# Patient Record
Sex: Female | Born: 1960 | Race: White | Hispanic: No | Smoking: Former smoker
Health system: Southern US, Community
[De-identification: ages and names within clinical notes are randomized; demographics above are authoritative.]

## PROBLEM LIST (undated history)

## (undated) DIAGNOSIS — K219 Gastro-esophageal reflux disease without esophagitis: Secondary | ICD-10-CM

## (undated) DIAGNOSIS — M549 Dorsalgia, unspecified: Secondary | ICD-10-CM

## (undated) DIAGNOSIS — E789 Disorder of lipoprotein metabolism, unspecified: Secondary | ICD-10-CM

## (undated) DIAGNOSIS — E785 Hyperlipidemia, unspecified: Secondary | ICD-10-CM

## (undated) DIAGNOSIS — G4730 Sleep apnea, unspecified: Secondary | ICD-10-CM

## (undated) DIAGNOSIS — G2581 Restless legs syndrome: Secondary | ICD-10-CM

## (undated) DIAGNOSIS — IMO0001 Reserved for inherently not codable concepts without codable children: Secondary | ICD-10-CM

## (undated) DIAGNOSIS — D126 Benign neoplasm of colon, unspecified: Principal | ICD-10-CM

## (undated) DIAGNOSIS — F32A Depression, unspecified: Secondary | ICD-10-CM

## (undated) DIAGNOSIS — E7800 Pure hypercholesterolemia, unspecified: Secondary | ICD-10-CM

## (undated) DIAGNOSIS — I10 Essential (primary) hypertension: Secondary | ICD-10-CM

## (undated) DIAGNOSIS — C801 Malignant (primary) neoplasm, unspecified: Secondary | ICD-10-CM

## (undated) DIAGNOSIS — F419 Anxiety disorder, unspecified: Secondary | ICD-10-CM

## (undated) DIAGNOSIS — F329 Major depressive disorder, single episode, unspecified: Secondary | ICD-10-CM

## (undated) HISTORY — DX: Hyperlipidemia, unspecified: E78.5

## (undated) HISTORY — PX: SKIN CANCER EXCISION: SHX779

## (undated) HISTORY — DX: Anxiety disorder, unspecified: F41.9

## (undated) HISTORY — DX: Disorder of lipoprotein metabolism, unspecified: E78.9

## (undated) HISTORY — PX: APPENDECTOMY: SHX54

## (undated) HISTORY — PX: GALLBLADDER SURGERY: SHX652

## (undated) HISTORY — PX: VAGINAL HYSTERECTOMY: SHX2639

## (undated) HISTORY — DX: Reserved for inherently not codable concepts without codable children: IMO0001

## (undated) HISTORY — DX: Pure hypercholesterolemia, unspecified: E78.00

## (undated) HISTORY — PX: GANGLION CYST EXCISION: SHX1691

## (undated) HISTORY — DX: Benign neoplasm of colon, unspecified: D12.6

## (undated) HISTORY — DX: Sleep apnea, unspecified: G47.30

## (undated) HISTORY — DX: Gastro-esophageal reflux disease without esophagitis: K21.9

## (undated) HISTORY — DX: Essential (primary) hypertension: I10

## (undated) HISTORY — DX: Major depressive disorder, single episode, unspecified: F32.9

## (undated) HISTORY — DX: Restless legs syndrome: G25.81

## (undated) HISTORY — DX: Depression, unspecified: F32.A

---

## 1987-11-09 HISTORY — PX: ABDOMINAL HYSTERECTOMY: SHX81

## 1996-11-08 HISTORY — PX: SKIN GRAFT: SHX250

## 2001-06-14 ENCOUNTER — Ambulatory Visit (HOSPITAL_COMMUNITY): Admission: RE | Admit: 2001-06-14 | Discharge: 2001-06-14 | Payer: Self-pay | Admitting: Internal Medicine

## 2001-06-14 ENCOUNTER — Encounter: Payer: Self-pay | Admitting: Internal Medicine

## 2003-06-03 ENCOUNTER — Ambulatory Visit (HOSPITAL_COMMUNITY): Admission: RE | Admit: 2003-06-03 | Discharge: 2003-06-03 | Payer: Self-pay | Admitting: Internal Medicine

## 2003-06-03 ENCOUNTER — Encounter: Payer: Self-pay | Admitting: Internal Medicine

## 2005-08-02 ENCOUNTER — Ambulatory Visit (HOSPITAL_COMMUNITY): Admission: RE | Admit: 2005-08-02 | Discharge: 2005-08-02 | Payer: Self-pay | Admitting: Preventative Medicine

## 2005-09-01 ENCOUNTER — Ambulatory Visit (HOSPITAL_COMMUNITY): Admission: RE | Admit: 2005-09-01 | Discharge: 2005-09-01 | Payer: Self-pay | Admitting: Internal Medicine

## 2006-09-01 ENCOUNTER — Ambulatory Visit: Payer: Self-pay | Admitting: Internal Medicine

## 2006-09-05 ENCOUNTER — Ambulatory Visit (HOSPITAL_COMMUNITY): Admission: RE | Admit: 2006-09-05 | Discharge: 2006-09-05 | Payer: Self-pay | Admitting: Family Medicine

## 2007-11-09 HISTORY — PX: CHOLECYSTECTOMY: SHX55

## 2008-05-07 ENCOUNTER — Ambulatory Visit (HOSPITAL_COMMUNITY): Admission: RE | Admit: 2008-05-07 | Discharge: 2008-05-07 | Payer: Self-pay | Admitting: Family Medicine

## 2008-05-22 ENCOUNTER — Encounter (INDEPENDENT_AMBULATORY_CARE_PROVIDER_SITE_OTHER): Payer: Self-pay | Admitting: General Surgery

## 2008-05-22 ENCOUNTER — Ambulatory Visit (HOSPITAL_COMMUNITY): Admission: RE | Admit: 2008-05-22 | Discharge: 2008-05-22 | Payer: Self-pay | Admitting: General Surgery

## 2008-08-14 ENCOUNTER — Ambulatory Visit (HOSPITAL_COMMUNITY): Admission: RE | Admit: 2008-08-14 | Discharge: 2008-08-14 | Payer: Self-pay | Admitting: Family Medicine

## 2008-11-20 ENCOUNTER — Ambulatory Visit (HOSPITAL_COMMUNITY): Admission: RE | Admit: 2008-11-20 | Discharge: 2008-11-20 | Payer: Self-pay | Admitting: Family Medicine

## 2009-01-02 ENCOUNTER — Ambulatory Visit (HOSPITAL_COMMUNITY): Admission: RE | Admit: 2009-01-02 | Discharge: 2009-01-02 | Payer: Self-pay | Admitting: Family Medicine

## 2009-06-17 ENCOUNTER — Encounter (INDEPENDENT_AMBULATORY_CARE_PROVIDER_SITE_OTHER): Payer: Self-pay | Admitting: General Surgery

## 2009-06-17 ENCOUNTER — Ambulatory Visit (HOSPITAL_COMMUNITY): Admission: RE | Admit: 2009-06-17 | Discharge: 2009-06-17 | Payer: Self-pay | Admitting: General Surgery

## 2009-06-17 DIAGNOSIS — D126 Benign neoplasm of colon, unspecified: Secondary | ICD-10-CM

## 2009-06-17 HISTORY — DX: Benign neoplasm of colon, unspecified: D12.6

## 2009-06-17 HISTORY — PX: COLONOSCOPY: SHX174

## 2009-08-18 ENCOUNTER — Ambulatory Visit (HOSPITAL_COMMUNITY): Admission: RE | Admit: 2009-08-18 | Discharge: 2009-08-18 | Payer: Self-pay | Admitting: Family Medicine

## 2010-08-28 ENCOUNTER — Ambulatory Visit (HOSPITAL_COMMUNITY): Admission: RE | Admit: 2010-08-28 | Discharge: 2010-08-28 | Payer: Self-pay | Admitting: Family Medicine

## 2010-09-28 ENCOUNTER — Ambulatory Visit (HOSPITAL_COMMUNITY): Admission: RE | Admit: 2010-09-28 | Discharge: 2010-09-28 | Payer: Self-pay | Admitting: Urology

## 2011-01-18 ENCOUNTER — Encounter: Payer: Self-pay | Admitting: Orthopedic Surgery

## 2011-02-09 ENCOUNTER — Encounter: Payer: Self-pay | Admitting: Orthopedic Surgery

## 2011-02-09 ENCOUNTER — Ambulatory Visit (INDEPENDENT_AMBULATORY_CARE_PROVIDER_SITE_OTHER): Payer: BC Managed Care – PPO | Admitting: Orthopedic Surgery

## 2011-02-09 VITALS — HR 68 | Resp 16 | Ht 66.0 in | Wt 300.0 lb

## 2011-02-09 DIAGNOSIS — G5600 Carpal tunnel syndrome, unspecified upper limb: Secondary | ICD-10-CM

## 2011-02-09 MED ORDER — GABAPENTIN 100 MG PO CAPS: 100.0000 mg | ORAL_CAPSULE | ORAL | Status: DC

## 2011-02-09 NOTE — Progress Notes (Signed)
RIGHT hand numbness, index, and long finger. Symptoms worse RIGHT than LEFT. 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 Some of which cause her increased symptoms. Symptoms started, gradually she's been wearing a brace for over a year. She has a burning pain in the palm of the hand, which radiates into the fingers and seems to come and go. She's describes definite numbness and tingling.  Status post excision of dorsal ganglion. Several years ago appears to be unrelated.  Current pain level IV/X.  Positive review of systems findings, fatigue, redness, and watering of the eyes, shortness of breath and wheezing with snoring, heartburn, nausea, diarrhea, frequency, muscle, pain, and joint pain, numbness, and tingling, anxiety, depression, seasonal allergy.   Family History  Problem Relation Age of Onset  . Arthritis    . Diabetes      Past Medical History  Diagnosis Date  . Diabetes mellitus   . Asthma   . HTN (hypertension)   . Restless leg syndrome   . Anxiety   . Depression   . Reflux   . Cholesterol serum elevated     Past Surgical History  Procedure Date  . Vaginal hysterectomy   . Gallbladder surgery   . Ganglion cyst excision hand x 2  . Skin cancer excision on nose    Vital signs reviewed and recorded as is.  General appearance is normal. Mod obesity  Orientation x 3, mood and affect are normal.  Ambulation is normal.  Decreased sensation is noted to sharp touch and soft touch in the long, index, and ring finger, RIGHT good LEFT. Weakness of grip RIGHT compared to LEFT. Range of motion wrist and normal bilaterally.  Phalen's test was positive on the RIGHT exhibited by throbbing. Negative on the LEFT.  Wrist joints are stable.  Pulses and perfusion and color and temperature are normal in both hands.

## 2011-02-10 ENCOUNTER — Ambulatory Visit: Payer: Self-pay | Admitting: Orthopedic Surgery

## 2011-02-11 ENCOUNTER — Telehealth: Payer: Self-pay | Admitting: Radiology

## 2011-02-11 NOTE — Telephone Encounter (Signed)
I faxed a referral for this patient to Dr. Gerilyn Pilgrim for a NCS of her upper extremities.

## 2011-02-13 LAB — GLUCOSE, CAPILLARY: Glucose-Capillary: 134 mg/dL — ABNORMAL HIGH (ref 70–99)

## 2011-03-23 NOTE — H&P (Signed)
NAMELASONJA, LAKINS            ACCOUNT NO.:  [id number]   MEDICAL RECORD NO.:  [id number]          PATIENT TYPE:  AMB   LOCATION:  DAY                           FACILITY:  [facility]   PHYSICIAN:  Dalia Heading, M.D.  DATE OF BIRTH:  04-05-1961   DATE OF ADMISSION:  DATE OF DISCHARGE:  LH                              HISTORY & PHYSICAL   CHIEF COMPLAINT:  Need for screening colonoscopy.   HISTORY OF PRESENT ILLNESS:  The patient is a 50 year old white female  who is referred for endoscopic evaluation.  She needs a colonoscopy for  screening purposes.  No abdominal pain, weight loss, nausea, vomiting,  diarrhea, constipation, melena, or hematochezia have been noted.  She  has never had a colonoscopy.  There is no family history of colon  carcinoma.   PAST MEDICAL HISTORY:  Hypertension, non-insulin dependent diabetes  mellitus, high cholesterol levels, muscle spasms.   PAST SURGICAL HISTORY:  Cholecystectomy.   CURRENT MEDICATIONS:  Fish oil, Prilosec, losartan, Claritin, Xanax,  metformin, Flexeril, Lasix, Budeprion, Caltrate, simvastatin, Motrin  allergies, penicillin, and sulfa.   REVIEW OF SYSTEMS:  Noncontributory.   PHYSICAL EXAMINATION:  GENERAL:  The patient is a well-developed, well-  nourished white female in no acute distress.  LUNGS:  Clear to auscultation with equal breath sounds bilaterally.  HEART:  Regular rate and rhythm without S3, S4, or murmurs.  ABDOMEN:  Soft, nontender, nondistended.  No hepatosplenomegaly or  masses are noted.  RECTUM:  Examination was deferred to the procedure.   IMPRESSION:  Need for screening colonoscopy.   PLAN:  The patient is scheduled for a colonoscopy on June 17, 2009.  The risks and benefits of the procedure including bleeding and  perforation were fully explained to the patient, gave informed consent.      Dalia Heading, M.D.  Electronically Signed     MAJ/MEDQ  D:  06/10/2009  T:  06/11/2009  Job:  [id number]   cc:   Corrie Mckusick, M.D.  Fax: [phone]   Short-Stay at [facility]

## 2011-03-23 NOTE — H&P (Signed)
[name], [name]            ACCOUNT NO.:  [id number]   MEDICAL RECORD NO.:  [id number]          PATIENT TYPE:  AMB   LOCATION:  DAY                           FACILITY:  [facility]   PHYSICIAN:  Tilford Pillar, MD      DATE OF BIRTH:  October 21, 1961   DATE OF ADMISSION:  DATE OF DISCHARGE:  LH                              HISTORY & PHYSICAL   CHIEF COMPLAINT:  Left-sided abdominal pain.   HISTORY OF PRESENT ILLNESS:  The patient is a 50 year old female with a  history of left-sided abdominal pain described as sharp, relatively  constant, and worst with palpation.  This started approximately April 15, 2008, with no significant change.  No notable trauma occurred.  He does  state that standing does seem to make it worse.  She has no relieving  features.  There is radiation to the back.  She does have occasional  nausea.  No changes in bowel movement.  Denies any hematochezia or  melena.  No changes with urination.  No changes with diet.  No fevers or  chills.  She does have significant bloating.  She does have belching and  flatus.   PAST MEDICAL HISTORY:  1. Diabetes mellitus type 2.  2. Gastroesophageal reflux disease.  3. Anxiety.   PAST SURGICAL HISTORY:  None.   MEDICATIONS:  Multiple and reviewed on the chart:  1. Benicar.  2. Xanax.  3. Prilosec.  4. Claritin.  5. Lasix.  6. Metformin.   ALLERGIES:  PENICILLIN causes rash and SULFA causes rash.   SOCIAL HISTORY:  No tobacco, no alcohol use, and no recreational drug  use.  Occupation:  She is a Location manager.   Pertinent family history is significant for positive gallbladder disease  and diabetes mellitus.   REVIEW OF SYSTEMS:  CONSTITUTIONAL:  Unremarkable.  EYES:  Unremarkable.  EARS, NOSE, AND THROAT:  Unremarkable.  RESPIRATORY:  Unremarkable.  CARDIOVASCULAR:  Unremarkable.  GASTROINTESTINAL:  Abdominal pain left-  sided as per HPI.  GENITOURINARY:  Unremarkable.  MUSCULOSKELETAL:  Arthralgias in the lower  back.  SKIN:  Unremarkable.  ENDOCRINE:  Heat  intolerance.  NEURO:  Unremarkable.   PHYSICAL EXAM:  GENERAL:  The patient is a morbidly obese, slightly  disheveled female in no acute distress.  Alert and oriented x3.  HEENT:  Scalp no deformities.  No masses.  Eyes:  Pupils equal, round,  and reactive.  Extraocular movements intact.  No scleral icterus or  conjunctival pallor is noted.  Oral mucosa is pink and moist.  No  malocclusion.  NECK:  Trachea is midline.  No cervical lymphadenopathy appreciated.  PULMONARY:  Unlabored respirations.  No wheezes.  No crackles.  She is  clear to auscultation in bilateral lung fields.  CARDIOVASCULAR:  Regular rate and rhythm.  No murmurs or gallops are  appreciated on evaluation.  Pulses are 2+ radial.  CHEST EVALUATION:  The patient does not have any notable masses on  either chest wall.  No chest wall deformity was noted.  She does have  left-sided chest wall pain with palpation of the costovertebral angle  tenderness with palpation of the lower ribs.  ABDOMEN:  Positive bowel sounds.  Abdomen is soft.  She does have some  slight tenderness in the left upper quadrant.  No right upper quadrant  or Murphy sign.  No hernias.  No masses.  SKIN:  Warm and dry.  EXTREMITIES:  Atraumatic.  No deformities.   PERTINENT LABORATORY AND RADIOGRAPHIC STUDIES:  The patient had a CT  evaluation of the abdomen and pelvis, which demonstrates no evidence of  acute process and no evidence of renal tumor.  She does have noted  gallstones in the gallbladder on evaluation.   ASSESSMENT AND PLAN:  1. Osteochondritis of the left chest wall.  2. Cholelithiasis.  In regards to the patient's presentation, it was      discussed at length with the patient her current symptomatology.      At this point, the majority of her symptomatology does appear to be      coming from the osteochondritis of the left chest wall based on      evaluation today.  We recommend  continuing nonsteroidal anti-      inflammatory medications, limited activity, and allow time for      recovery of this.  The patient's concerns were addressed regarding      this.  I do not think any acute injury has occurred.  I have low      suspicion for any refracture or bony abnormalities.  3. In regards to her cholelithiasis, she is a diabetic patient with      moderate obesity.  I did discuss with the patient that in the near      future risks for cholelithiasis, I would recommend proceeding with      a laparoscopic possible open cholecystectomy given the fact of her      diabetes in the presence of biliary stones.  Risks, benefits, and      alternatives of laparoscopic possible open cholecystectomy were      discussed at length with the patient including, but not limited to      the risk of bleeding, infection, bile leak, small bowel injury,      bile duct injury, as well as the possibility of intraoperative      cardiac or pulmonary events.  Complications associated with the      gallstones in a diabetic patient were also discussed with the      patient including but not limited to risks of acute cholecystitis      concerning cholangitis as well as a possibility of pancreatitis.      This was discussed with the      patient.  I did explain at length her the symptomatology from the      osteochondritis will not improve with cholecystectomy; however, the      patient does wish to proceed at this time with the laparoscopic      possible open cholecystectomy.  We will proceed with the patient      understanding the risks, benefits, and alternatives.      Tilford Pillar, MD  Electronically Signed     BZ/MEDQ  D:  05/21/2008  T:  05/22/2008  Job:  [id number]   cc:   Corrie Mckusick, M.D.  Fax: [phone]

## 2011-03-24 ENCOUNTER — Ambulatory Visit (INDEPENDENT_AMBULATORY_CARE_PROVIDER_SITE_OTHER): Payer: BC Managed Care – PPO | Admitting: Orthopedic Surgery

## 2011-03-24 ENCOUNTER — Encounter: Payer: Self-pay | Admitting: Orthopedic Surgery

## 2011-03-24 DIAGNOSIS — G5601 Carpal tunnel syndrome, right upper limb: Secondary | ICD-10-CM

## 2011-03-24 DIAGNOSIS — G5600 Carpal tunnel syndrome, unspecified upper limb: Secondary | ICD-10-CM

## 2011-03-24 MED ORDER — METHYLPREDNISOLONE ACETATE 40 MG/ML IJ SUSP: 40.0000 mg | Freq: Once | INTRAMUSCULAR | Status: AC

## 2011-03-24 NOTE — Patient Instructions (Signed)
You have received a steroid shot. 15% of patients experience increased pain at the injection site with in the next 24 hours. This is best treated with ice and tylenol extra strength 2 tabs every 8 hours. If you are still having pain please call the office.    

## 2011-03-24 NOTE — Progress Notes (Signed)
Carpal tunnel injection RIGHT  Consent  Time out  The carpal tunnel was injected with Depo-Medrol 40 mg 1 cc and 3 cc of 1% lidocaine tolerated well without complication  He uses alcohol and ethyl chloride for prep.

## 2011-03-24 NOTE — Progress Notes (Signed)
Followup visit  History of bilateral carpal tunnel syndrome sent for nerve conduction study which shows severe RIGHT median neuropathy moderately severe on the LEFT  RIGHT seems to bother her the most  We offered her further medical treatment, surgical treatment she preferred injection  We gave her a RIGHT carpal tunnel injection  LEFT side seems to bother her as well but not as much.  We do note that the Neurontin didn't help her pain overall  Review of systems she had some swelling of the RIGHT thumb which seems to be a bone spur  Exam of the RIGHT thumb shows equal laxity to the LEFT with good stability in flexion and extension.  No tenderness.  Diagnosis bilateral carpal tunnel syndrome RIGHT greater than LEFT  Patient will call us if this does not help her.

## 2011-03-26 ENCOUNTER — Telehealth: Payer: Self-pay | Admitting: Orthopedic Surgery

## 2011-03-26 NOTE — Op Note (Signed)
[name], [name]            ACCOUNT NO.:  [id number]   MEDICAL RECORD NO.:  [id number]          PATIENT TYPE:  AMB   LOCATION:  DAY                           FACILITY:  [facility]   PHYSICIAN:  Tilford Pillar, MD      DATE OF BIRTH:  24-Dec-1960   DATE OF PROCEDURE:  DATE OF DISCHARGE:                               OPERATIVE REPORT   PREOPERATIVE DIAGNOSIS:  Cholelithiasis.   POSTOPERATIVE DIAGNOSIS:  Cholelithiasis.   PROCEDURE:  Laparoscopic cholecystectomy.   SURGEON:  Tilford Pillar, MD   ANESTHESIA:  General endotracheal local anesthetic, 0.5% Marcaine plain.   ESTIMATED BLOOD LOSS:  Minimal.   INDICATIONS:  The patient is a 50 year old patient who presents to my  office after referral by Dr. Phillips Odor for evaluation of cholelithiasis.  She actually had the initial workup started after having a bout of  costochondritis of the left chest wall.  She had actually presented with  left chest wall pain and during the workup was diagnosed with the  cholelithiasis.  She has had some symptomatology, which is consistent  with biliary disease, although at this point she has no significant  symptomatology.  She is also diabetic with the presence of gallstones  and the risk of nonintervention were discussed at length with the  patient.  The risks, benefits, alternatives of laparoscopic possible  open cholecystectomy including, but not limited to risk of bleeding,  infection, bile leak, small bowel injury as well as possibility of  common bile duct injury with possible intraoperative cardiac or  pulmonary events were discussed at length with the patient.  The  patient's questions and concerns were addressed, and the patient was  consented for planned procedure.   OPERATION:  The patient was taken to the operating room and was placed  in supine position on the operating table, at which time a general  anesthetic was administered.  Once the patient was asleep, she was  endotracheally  intubated by anesthesia.  At this point, her abdomen was  prepped and draped in the usual fashion.  Stab incision was created  supraumbilically with an 11-blade scalpel.  Additional dissection down  through subcuticular tissue was carried out using a Kocher clamp, which  was utilized to grasp the anterior abdominal fascia and move this  anteriorly.  A Veress needle was inserted.  Saline drop test was  utilized to confirm intraperitoneal placement and then pneumoperitoneum  was initiated.  Once sufficient pneumoperitoneum was obtained, an 11-mm  trocar was inserted over the laparoscope allowing visualization of the  trocar entering into the peritoneum.  At this point, the inner cannula  was removed.  The laparoscope was reinserted, and there was no evidence  of any trocar or Veress needle placement injury.  At this time, the  remaining trocars were placed, 11-mm trocar in the epigastrium, 5-mm  trocar between two 11-mm trocars and a 5-mm trocar in the right lateral  abdominal wall.  The patient was placed in reverse Trendelenburg left  lateral decubitus position.  The fundus of gallbladder were grasped and  lifted up over the liver.  A blunt dissection was  utilized to dissect  the peritoneal reflection off the infundibulum exposing the cystic duct  as it entered into the infundibulum.  A window was created behind the  cystic duct, three Endoclips were placed proximally, one distally, and  the cystic duct was divided between two most distal clips.  Similarly,  the cystic artery was identified.  One window was created behind the  structures as well and 2 Endoclips were placed proximally, one distally  and cystic artery was divided between two most distal clips.  Electrocautery was utilized to dissect the gallbladder free from the  gallbladder fossa.  Hemostasis was obtained during this using  electrocautery.  Once the gallbladder was free, it was placed in  EndoCatch bag, which was then  placed up and over the right lobe of the  liver.  There was a small amount of bile spillage during this process  from a small cholecystotomy created during the dissection, which was  closely aspirated with just a regular aspirator.  No stones were spilled  with the gallbladder up and over the right lobe of the liver.  Attention  was turned to closure.   A 2-0 Vicryl suture was placed through both the 11-mm trocars with an  Endoclose suture passing device with the sutures in place.  The  gallbladder fossa was reevaluated.  Hemostasis was excellent.  A piece  of Surgicel was placed into the gallbladder fossa and then the  gallbladder was removed in an intact EndoCatch bag through the  epigastric trocar site.  Some blunt dilatation was required to enlarge  the trocar site enough to remove the gallbladder.  Once the gallbladder  was free, it was placed on the back table and sent as a permanent  specimen to pathology.  At this point, the pneumoperitoneum was  evacuated.  The trocars were removed.  The Vicryl sutures were secured.  Local anesthetic was instilled and 4-0 Monocryl was utilized to  reapproximate the skin edges at all four trocar sites.  Skin was washed  and dried with a moist dry towel.  Benzoin was applied around the  incision.  Half inch Steri-Strips were placed.  The patient was allowed  to come out of general anesthetic and was transferred back to regular  hospital bed in stable condition.  At the conclusion of the procedure,  all sponge and needle counts were correct.  The patient tolerated the  procedure well.      Tilford Pillar, MD  Electronically Signed     BZ/MEDQ  D:  05/30/2008  T:  05/31/2008  Job:  [id number]   cc:   Dr. Phillips Odor

## 2011-03-26 NOTE — Telephone Encounter (Signed)
Received injection 03/24/11, thumb & index still numb, having pain when bending wrist or using her hand.  Taking 1,000 mg Tylenol every 8 hours and using ice when not at work. Concerned that she still is hurting.  Her # is [phone], can leave a message, as she is the only one there

## 2011-03-29 NOTE — Telephone Encounter (Signed)
Called and left message on machine to call me with her pharmacy

## 2011-03-29 NOTE — Telephone Encounter (Signed)
Chasity call this patient and tell her she is having post injection reaction   Apply ice and call her in some prednisone 10 mg tid x 3 days and neurontin 100mg  tid x 10 days

## 2011-03-30 ENCOUNTER — Other Ambulatory Visit: Payer: Self-pay | Admitting: *Deleted

## 2011-03-30 DIAGNOSIS — R52 Pain, unspecified: Secondary | ICD-10-CM

## 2011-03-30 MED ORDER — GABAPENTIN 100 MG PO CAPS: 100.0000 mg | ORAL_CAPSULE | Freq: Three times a day (TID) | ORAL | Status: DC

## 2011-03-30 NOTE — Telephone Encounter (Signed)
[name] called back, said she cannot take Predisone because it elevates her sugar level a lot.  As far as she knows, the  Neurontin is ok.  She uses [facility]

## 2011-04-08 ENCOUNTER — Encounter: Payer: Self-pay | Admitting: Orthopedic Surgery

## 2011-06-01 ENCOUNTER — Ambulatory Visit (INDEPENDENT_AMBULATORY_CARE_PROVIDER_SITE_OTHER): Payer: BC Managed Care – PPO | Admitting: Orthopedic Surgery

## 2011-06-01 ENCOUNTER — Encounter: Payer: Self-pay | Admitting: Orthopedic Surgery

## 2011-06-01 DIAGNOSIS — R52 Pain, unspecified: Secondary | ICD-10-CM

## 2011-06-01 MED ORDER — GABAPENTIN 100 MG PO CAPS: 300.0000 mg | ORAL_CAPSULE | Freq: Once | ORAL | Status: DC

## 2011-06-01 NOTE — Progress Notes (Signed)
Followup visit, carpal tunnel syndrome, RIGHT wrist, and hand.  Continues with numbness in the thumb and index finger.  We discussed her medication including Neurontin, decided to go with 300 mg q.h.s.    She decided to go ahead and have the surgery, come back in August to set up the surgery.

## 2011-06-01 NOTE — Patient Instructions (Signed)
Come back end of August to schedule CTR

## 2011-07-06 ENCOUNTER — Encounter: Payer: Self-pay | Admitting: Orthopedic Surgery

## 2011-07-06 ENCOUNTER — Ambulatory Visit (INDEPENDENT_AMBULATORY_CARE_PROVIDER_SITE_OTHER): Payer: BC Managed Care – PPO | Admitting: Orthopedic Surgery

## 2011-07-06 DIAGNOSIS — G5600 Carpal tunnel syndrome, unspecified upper limb: Secondary | ICD-10-CM

## 2011-07-06 DIAGNOSIS — G5601 Carpal tunnel syndrome, right upper limb: Secondary | ICD-10-CM

## 2011-07-06 NOTE — Patient Instructions (Signed)
You have been scheduled for surgery.  All surgeries carry some risk.  Remember you always have the option of continued nonsurgical treatment. However in this situation the risks vs. the benefits favor surgery as the best treatment option. The risks of the surgery includes the following but is not limited to bleeding, infection, pulmonary embolus, death from anesthesia, nerve injury vascular injury or need for further surgery, continued pain.  Specific to this procedure the following risks and complications are rare but possible Tenderness over the incision, and incomplete relief of numbness and tingling

## 2011-07-06 NOTE — Progress Notes (Signed)
Chief complaint: pain paresthesias right hand / pre-op for surgery HPI:(25) A 50 year old female with history of carpal tunnel syndrome of the RIGHT upper extremity treated with nonoperative measures, including oral medications, and wrist injections, as well as splinting, and activity modification. These measures have failed to give her satisfactory relief, and she would like to proceed with a carpal tunnel release via open technique.  Risk-benefit ratio has been explained to the patient. She has agreed to the surgical treatment with the understanding that she may have some skin irritation from the incision and may not have complete relief of her symptoms.  ROS: Positive review of systems findings, fatigue, redness, and watering of the eyes, shortness of breath and wheezing with snoring, heartburn, nausea, diarrhea, frequency, muscle, pain, and joint pain, numbness, and tingling, anxiety, depression, seasonal allergy.   PFSH: (1)  Past Medical History  Diagnosis Date  . Diabetes mellitus   . Asthma   . HTN (hypertension)   . Restless leg syndrome   . Anxiety   . Depression   . Reflux   . Cholesterol serum elevated    Past Surgical History  Procedure Date  . Vaginal hysterectomy   . Gallbladder surgery   . Ganglion cyst excision hand x 2  . Skin cancer excision on nose   History   Social History  . Marital Status: Widowed    Spouse Name: N/A    Number of Children: N/A  . Years of Education: 12th grade   Occupational History  . sewing Location manager    Social History Main Topics  . Smoking status: Never Smoker   . Smokeless tobacco: Not on file  . Alcohol Use: No  . Drug Use: Not on file  . Sexually Active: Not on file   Other Topics Concern  . Not on file   Social History Narrative  . No narrative on file      Physical Exam(12) GENERAL: normal development   CDV: pulses are normal   Skin: normal  Lymph: nodes were not palpable/normal  Psychiatric: awake,  alert and oriented  Neuro: normal sensation  MSK right upper extremity 1 Tenderness over the carpal tunnel. 2 Normal range of motion. 3 Normal strength. 4 Decreased sensation in the carpal tunnel distribution of the median nerve of the RIGHT upper extremity. 5 Wrist stability, normal. 6 Positive Tinel's test. Positive flexion test.  Imaging: No imaging, needed  Assessment: RIGHT carpal tunnel syndrome    Plan: Open RIGHT carpal tunnel release scheduled for September 5 with a post a visit scheduled for September 10.  You have been scheduled for surgery.  All surgeries carry some risk.  Remember you always have the option of continued nonsurgical treatment. However in this situation the risks vs. the benefits favor surgery as the best treatment option. The risks of the surgery includes the following but is not limited to bleeding, infection, pulmonary embolus, death from anesthesia, nerve injury vascular injury or need for further surgery, continued pain.  Specific to this procedure the following risks and complications are rare but possible Tenderness over the incision, and incomplete relief of numbness and tingling

## 2011-07-08 NOTE — Patient Instructions (Addendum)
18 [name]  07/08/2011   Your procedure is scheduled on:07/14/2011  Report to [name] at   615  AM.  Call this number if you have problems the morning of surgery: [phone]   Remember:   Do not eat food:After Midnight.  Do not drink clear liquids: After Midnight.  Take these medicines the morning of surgery with A SIP OF WATER: alprazolam,wellbutrin,citalopram,prilosec,neurontin   Do not wear jewelry, make-up or nail polish.  Do not wear lotions, powders, or perfumes. You may wear deodorant.  Do not shave 48 hours prior to surgery.  Do not bring valuables to the hospital.  Contacts, dentures or bridgework may not be worn into surgery.  Leave suitcase in the car. After surgery it may be brought to your room.  For patients admitted to the hospital, checkout time is 11:00 AM the day of discharge.   Patients discharged the day of surgery will not be allowed to drive home.  Name and phone number of your driver: family  Special Instructions: CHG Shower Use Special Wash: 1/2 bottle night before surgery and 1/2 bottle morning of surgery.   Please read over the following fact sheets that you were given: Pain Booklet, MRSA Information, Surgical Site Infection Prevention, Anesthesia Post-op Instructions and Care and Recovery After Surgery PATIENT INSTRUCTIONS POST-ANESTHESIA  IMMEDIATELY FOLLOWING SURGERY:  Do not drive or operate machinery for the first twenty four hours after surgery.  Do not make any important decisions for twenty four hours after surgery or while taking narcotic pain medications or sedatives.  If you develop intractable nausea and vomiting or a severe headache please notify your doctor immediately.  FOLLOW-UP:  Please make an appointment with your surgeon as instructed. You do not need to follow up with anesthesia unless specifically instructed to do so.  WOUND CARE INSTRUCTIONS (if applicable):  Keep a dry clean dressing on the anesthesia/puncture wound site if  there is drainage.  Once the wound has quit draining you may leave it open to air.  Generally you should leave the bandage intact for twenty four hours unless there is drainage.  If the epidural site drains for more than 36-48 hours please call the anesthesia department.  QUESTIONS?:  Please feel free to call your physician or the hospital operator if you have any questions, and they will be happy to assist you.     [facility] Anesthesia Department [address] [place] [phone]

## 2011-07-09 ENCOUNTER — Encounter (HOSPITAL_COMMUNITY): Payer: Self-pay

## 2011-07-09 ENCOUNTER — Telehealth: Payer: Self-pay | Admitting: Orthopedic Surgery

## 2011-07-09 ENCOUNTER — Encounter (HOSPITAL_COMMUNITY)
Admission: RE | Admit: 2011-07-09 | Discharge: 2011-07-09 | Disposition: A | Discharge status: Home / Self Care | Payer: BC Managed Care – PPO | Source: Ambulatory Visit | Attending: Orthopedic Surgery | Admitting: Orthopedic Surgery

## 2011-07-09 ENCOUNTER — Other Ambulatory Visit: Payer: Self-pay

## 2011-07-09 HISTORY — DX: Gastro-esophageal reflux disease without esophagitis: K21.9

## 2011-07-09 HISTORY — DX: Malignant (primary) neoplasm, unspecified: C80.1

## 2011-07-09 LAB — SURGICAL PCR SCREEN
MRSA, PCR: NEGATIVE
Staphylococcus aureus: NEGATIVE

## 2011-07-09 LAB — BASIC METABOLIC PANEL
Chloride: 99 mEq/L (ref 96–112)
GFR calc Af Amer: >60 mL/min (ref >60)
Potassium: 3.1 mEq/L — ABNORMAL LOW (ref 3.5–5.1)
Sodium: 139 mEq/L (ref 135–145)

## 2011-07-09 LAB — CBC
Platelets: 292 10*3/uL (ref 150–400)
RDW: 13.2 % (ref 11.5–15.5)
WBC: 12.4 10*3/uL — ABNORMAL HIGH (ref 4.0–10.5)

## 2011-07-09 NOTE — Telephone Encounter (Signed)
Called [employer] at [phone] re: out-patient surgery scheduled at [facility] 07/14/11, CPT code 08657, ICD9 code 354.0.  Spoke with Bonnita T; states no pre-authorization required for this procedure.  Her name and today's date for reference.

## 2011-07-13 NOTE — Pre-Procedure Instructions (Signed)
Potassium level 3.1. Dr. Jayme Cloud notified of results. Order obtained for istat morning prior to surgery.

## 2011-07-13 NOTE — H&P (Signed)
Chief complaint: pain paresthesias right hand / pre-op for surgery HPI:(4) A 50-year-old female with history of carpal tunnel syndrome of the RIGHT upper extremity treated with nonoperative measures, including oral medications, and wrist injections, as well as splinting, and activity modification. These measures have failed to give her satisfactory relief, and she would like to proceed with a carpal tunnel release via open technique.  Risk-benefit ratio has been explained to the patient. She has agreed to the surgical treatment with the understanding that she may have some skin irritation from the incision and may not have complete relief of her symptoms.  ROS: Positive review of systems findings, fatigue, redness, and watering of the eyes, shortness of breath and wheezing with snoring, heartburn, nausea, diarrhea, frequency, muscle, pain, and joint pain, numbness, and tingling, anxiety, depression, seasonal allergy.   PFSH: (1)  Past Medical History  Diagnosis Date  . Diabetes mellitus   . Asthma   . HTN (hypertension)   . Restless leg syndrome   . Anxiety   . Depression   . Reflux   . Cholesterol serum elevated    Past Surgical History  Procedure Date  . Vaginal hysterectomy   . Gallbladder surgery   . Ganglion cyst excision hand x 2  . Skin cancer excision on nose   History   Social History  . Marital Status: Widowed    Spouse Name: N/A    Number of Children: N/A  . Years of Education: 12th grade   Occupational History  . sewing machine operator    Social History Main Topics  . Smoking status: Never Smoker   . Smokeless tobacco: Not on file  . Alcohol Use: No  . Drug Use: Not on file  . Sexually Active: Not on file   Other Topics Concern  . Not on file   Social History Narrative  . No narrative on file      Physical Exam(12) GENERAL: normal development   CDV: pulses are normal   Skin: normal  Lymph: nodes were not palpable/normal  Psychiatric: awake,  alert and oriented  Neuro: normal sensation  MSK right upper extremity 1 Tenderness over the carpal tunnel. 2 Normal range of motion. 3 Normal strength. 4 Decreased sensation in the carpal tunnel distribution of the median nerve of the RIGHT upper extremity. 5 Wrist stability, normal. 6 Positive Tinel's test. Positive flexion test.  Imaging: No imaging, needed  Assessment: RIGHT carpal tunnel syndrome    Plan: Open RIGHT carpal tunnel release scheduled for September 5 with a post a visit scheduled for September 10.  You have been scheduled for surgery.  All surgeries carry some risk.  Remember you always have the option of continued nonsurgical treatment. However in this situation the risks vs. the benefits favor surgery as the best treatment option. The risks of the surgery includes the following but is not limited to bleeding, infection, pulmonary embolus, death from anesthesia, nerve injury vascular injury or need for further surgery, continued pain.  Specific to this procedure the following risks and complications are rare but possible Tenderness over the incision, and incomplete relief of numbness and tingling     of numbness and tingling

## 2011-07-14 ENCOUNTER — Ambulatory Visit (HOSPITAL_COMMUNITY)
Admission: RE | Admit: 2011-07-14 | Discharge: 2011-07-14 | Disposition: A | Discharge status: Home / Self Care | Payer: BC Managed Care – PPO | Source: Ambulatory Visit | Attending: Orthopedic Surgery | Admitting: Orthopedic Surgery

## 2011-07-14 ENCOUNTER — Encounter (HOSPITAL_COMMUNITY): Payer: Self-pay | Admitting: Anesthesiology

## 2011-07-14 ENCOUNTER — Encounter (HOSPITAL_COMMUNITY)
Admission: RE | Disposition: A | Discharge status: Home / Self Care | Payer: Self-pay | Source: Ambulatory Visit | Attending: Orthopedic Surgery

## 2011-07-14 ENCOUNTER — Ambulatory Visit (HOSPITAL_COMMUNITY): Payer: BC Managed Care – PPO | Admitting: Anesthesiology

## 2011-07-14 ENCOUNTER — Encounter (HOSPITAL_COMMUNITY): Payer: Self-pay | Admitting: *Deleted

## 2011-07-14 DIAGNOSIS — G5601 Carpal tunnel syndrome, right upper limb: Secondary | ICD-10-CM

## 2011-07-14 DIAGNOSIS — Z79899 Other long term (current) drug therapy: Secondary | ICD-10-CM | POA: Insufficient documentation

## 2011-07-14 DIAGNOSIS — E119 Type 2 diabetes mellitus without complications: Secondary | ICD-10-CM | POA: Insufficient documentation

## 2011-07-14 DIAGNOSIS — G5600 Carpal tunnel syndrome, unspecified upper limb: Secondary | ICD-10-CM | POA: Insufficient documentation

## 2011-07-14 DIAGNOSIS — I10 Essential (primary) hypertension: Secondary | ICD-10-CM | POA: Insufficient documentation

## 2011-07-14 DIAGNOSIS — Z01812 Encounter for preprocedural laboratory examination: Secondary | ICD-10-CM | POA: Insufficient documentation

## 2011-07-14 HISTORY — PX: CARPAL TUNNEL RELEASE: SHX101

## 2011-07-14 SURGERY — CARPAL TUNNEL RELEASE
Anesthesia: Regional | Site: Wrist | Laterality: Right | Wound class: Clean

## 2011-07-14 MED ORDER — HYDROCODONE-ACETAMINOPHEN 5-325 MG PO TABS
1.0000 | ORAL_TABLET | Freq: Once | ORAL | Status: AC
  Administered 2011-07-14: 1 via ORAL

## 2011-07-14 MED ORDER — ONDANSETRON HCL 4 MG/2ML IJ SOLN: 4.0000 mg | Freq: Once | INTRAMUSCULAR | Status: DC | PRN

## 2011-07-14 MED ORDER — LIDOCAINE HCL (PF) 0.5 % IJ SOLN
INTRAMUSCULAR | Status: AC
  Filled 2011-07-14: qty 50

## 2011-07-14 MED ORDER — MIDAZOLAM HCL 2 MG/2ML IJ SOLN
INTRAMUSCULAR | Status: AC
  Filled 2011-07-14: qty 2

## 2011-07-14 MED ORDER — PROPOFOL 10 MG/ML IV EMUL
INTRAVENOUS | Status: AC
  Filled 2011-07-14: qty 20

## 2011-07-14 MED ORDER — FENTANYL CITRATE 0.05 MG/ML IJ SOLN: 25.0000 ug | INTRAMUSCULAR | Status: DC | PRN

## 2011-07-14 MED ORDER — VANCOMYCIN HCL IN DEXTROSE 1-5 GM/200ML-% IV SOLN
INTRAVENOUS | Status: AC
  Administered 2011-07-14: 1000 mg via INTRAVENOUS
  Filled 2011-07-14: qty 200

## 2011-07-14 MED ORDER — VANCOMYCIN HCL IN DEXTROSE 1-5 GM/200ML-% IV SOLN
1000.0000 mg | INTRAVENOUS | Status: AC
  Administered 2011-07-14: 1000 mg via INTRAVENOUS

## 2011-07-14 MED ORDER — LIDOCAINE HCL (PF) 1 % IJ SOLN
INTRAMUSCULAR | Status: AC
  Filled 2011-07-14: qty 5

## 2011-07-14 MED ORDER — HYDROCODONE-ACETAMINOPHEN 7.5-325 MG PO TABS: 1.0000 | ORAL_TABLET | ORAL | Status: AC | PRN

## 2011-07-14 MED ORDER — SODIUM CHLORIDE 0.9 % IV SOLN: INTRAVENOUS | Status: DC

## 2011-07-14 MED ORDER — HYDROCODONE-ACETAMINOPHEN 5-325 MG PO TABS
ORAL_TABLET | ORAL | Status: AC
  Filled 2011-07-14: qty 1

## 2011-07-14 MED ORDER — SODIUM CHLORIDE 0.9 % IR SOLN
Status: DC | PRN
  Administered 2011-07-14: 1

## 2011-07-14 MED ORDER — MIDAZOLAM HCL 2 MG/2ML IJ SOLN
1.0000 mg | INTRAMUSCULAR | Status: DC | PRN
  Administered 2011-07-14 (×2): 2 mg via INTRAVENOUS

## 2011-07-14 MED ORDER — LACTATED RINGERS IV SOLN
INTRAVENOUS | Status: DC
  Administered 2011-07-14: 1000 mL via INTRAVENOUS

## 2011-07-14 MED ORDER — MIDAZOLAM HCL 2 MG/2ML IJ SOLN
INTRAMUSCULAR | Status: AC
  Administered 2011-07-14: 2 mg via INTRAVENOUS
  Filled 2011-07-14: qty 2

## 2011-07-14 MED ORDER — BUPIVACAINE HCL (PF) 0.5 % IJ SOLN
INTRAMUSCULAR | Status: DC | PRN
  Administered 2011-07-14: 5 mL

## 2011-07-14 MED ORDER — PROPOFOL 10 MG/ML IV EMUL
INTRAVENOUS | Status: DC | PRN
  Administered 2011-07-14: 35 ug/kg/min via INTRAVENOUS

## 2011-07-14 MED ORDER — FENTANYL CITRATE 0.05 MG/ML IJ SOLN
INTRAMUSCULAR | Status: DC | PRN
  Administered 2011-07-14 (×2): 50 ug via INTRAVENOUS

## 2011-07-14 MED ORDER — LACTATED RINGERS IV SOLN
INTRAVENOUS | Status: DC | PRN
  Administered 2011-07-14: 07:00:00 via INTRAVENOUS

## 2011-07-14 MED ORDER — FENTANYL CITRATE 0.05 MG/ML IJ SOLN
INTRAMUSCULAR | Status: AC
  Filled 2011-07-14: qty 2

## 2011-07-14 MED ORDER — MIDAZOLAM HCL 5 MG/5ML IJ SOLN
INTRAMUSCULAR | Status: DC | PRN
  Administered 2011-07-14: 2 mg via INTRAVENOUS

## 2011-07-14 MED ORDER — BUPIVACAINE HCL (PF) 0.5 % IJ SOLN
INTRAMUSCULAR | Status: AC
  Filled 2011-07-14: qty 30

## 2011-07-14 SURGICAL SUPPLY — 39 items
BAG HAMPER (MISCELLANEOUS) ×2 IMPLANT
BANDAGE ELASTIC 2 VELCRO NS LF (GAUZE/BANDAGES/DRESSINGS) ×2 IMPLANT
BANDAGE ESMARK 4X12 BL STRL LF (DISPOSABLE) ×1 IMPLANT
BANDAGE GAUZE ELAST BULKY 4 IN (GAUZE/BANDAGES/DRESSINGS) ×2 IMPLANT
BNDG CMPR 12X4 ELC STRL LF (DISPOSABLE) ×1
BNDG COHESIVE 4X5 TAN NS LF (GAUZE/BANDAGES/DRESSINGS) ×2 IMPLANT
BNDG ESMARK 4X12 BLUE STRL LF (DISPOSABLE) ×2
CHLORAPREP W/TINT 26ML (MISCELLANEOUS) ×2 IMPLANT
CLOTH BEACON ORANGE TIMEOUT ST (SAFETY) ×2 IMPLANT
COVER LIGHT HANDLE STERIS (MISCELLANEOUS) ×4 IMPLANT
CUFF TOURNIQUET SINGLE 18IN (TOURNIQUET CUFF) IMPLANT
CUFF TOURNIQUET SINGLE 24IN (TOURNIQUET CUFF) ×2 IMPLANT
DECANTER SPIKE VIAL GLASS SM (MISCELLANEOUS) IMPLANT
DRAPE PROXIMA HALF (DRAPES) ×2 IMPLANT
DRSG XEROFORM 1X8 (GAUZE/BANDAGES/DRESSINGS) IMPLANT
ELECT NEEDLE TIP 2.8 STRL (NEEDLE) ×2 IMPLANT
ELECT REM PT RETURN 9FT ADLT (ELECTROSURGICAL) ×2
ELECTRODE REM PT RTRN 9FT ADLT (ELECTROSURGICAL) ×1 IMPLANT
GAUZE SPONGE 4X4 12PLY STRL LF (GAUZE/BANDAGES/DRESSINGS) ×2 IMPLANT
GAUZE XEROFORM 1X8 LF (GAUZE/BANDAGES/DRESSINGS) ×2 IMPLANT
GLOVE BIOGEL PI IND STRL 7.0 (GLOVE) ×1 IMPLANT
GLOVE BIOGEL PI INDICATOR 7.0 (GLOVE) ×1
GLOVE ECLIPSE 6.5 STRL STRAW (GLOVE) ×2 IMPLANT
GLOVE SKINSENSE NS SZ8.0 LF (GLOVE) ×1
GLOVE SKINSENSE STRL SZ8.0 LF (GLOVE) ×1 IMPLANT
GLOVE SS N UNI LF 8.5 STRL (GLOVE) ×2 IMPLANT
GOWN BRE IMP SLV AUR XL STRL (GOWN DISPOSABLE) ×2 IMPLANT
GOWN STRL REIN XL XLG (GOWN DISPOSABLE) ×2 IMPLANT
HAND ALUMI XLG (SOFTGOODS) ×2 IMPLANT
KIT ROOM TURNOVER APOR (KITS) ×2 IMPLANT
MANIFOLD NEPTUNE II (INSTRUMENTS) ×2 IMPLANT
NEEDLE HYPO 21X1.5 SAFETY (NEEDLE) ×2 IMPLANT
NS IRRIG 1000ML POUR BTL (IV SOLUTION) ×2 IMPLANT
PACK BASIC LIMB (CUSTOM PROCEDURE TRAY) ×2 IMPLANT
PAD ARMBOARD 7.5X6 YLW CONV (MISCELLANEOUS) ×2 IMPLANT
SET BASIN LINEN APH (SET/KITS/TRAYS/PACK) ×2 IMPLANT
SPONGE GAUZE 4X4 12PLY (GAUZE/BANDAGES/DRESSINGS) IMPLANT
SUT ETHILON 3 0 FSL (SUTURE) ×2 IMPLANT
SYR CONTROL 10ML LL (SYRINGE) ×2 IMPLANT

## 2011-07-14 NOTE — Interval H&P Note (Signed)
History and Physical Interval Note:   07/14/2011   7:23 AM   [name]  has presented today for surgery, with the diagnosis of carpal tunnel syndrome right wrist  The various methods of treatment have been discussed with the patient and family. After consideration of risks, benefits and other options for treatment, the patient has consented to  Procedure(s):RIGHT  CARPAL TUNNEL RELEASE as a surgical intervention .  I have reviewed the patients' chart and labs.  Questions were answered to the patient's satisfaction.     Fuller Canada  MD

## 2011-07-14 NOTE — Transfer of Care (Addendum)
Immediate Anesthesia Transfer of Care Note  Patient: [name]  Procedure(s) Performed:  CARPAL TUNNEL RELEASE  Patient Location: PACU  Anesthesia Type: Bier block  Level of Consciousness: awake, alert  and oriented  Airway & Oxygen Therapy: Patient Spontanous Breathing  Post-op Assessment: Report given to PACU RN  Post vital signs: stable  Complications: No apparent anesthesia complications

## 2011-07-14 NOTE — Anesthesia Procedure Notes (Addendum)
  Narrative:    Regional Block:  Bier block (IV Regional)  Pre-Anesthetic Checklist: ,, timeout performed, Correct Patient, Correct Site, Correct Laterality, Correct Procedure, Correct Position, site marked, Risks and benefits discussed, Surgical consent, Pre-op evaluation  Laterality: Right     Bier block (IV Regional) Narrative:  Start time: 07/14/2011 7:53 AM Resident/CRNA: Glynn Octave, CRNA  Additional Notes: Single tourniquet inflated to 263mm/hg,  .5% lidocaine, 50cc injected via #22 G saline lock.

## 2011-07-14 NOTE — Op Note (Signed)
Operative report  Date of surgery 07/14/2011  Preop diagnosis carpal tunnel syndrome right wrist Postop diagnosis same Procedure open carpal tunnel release right wrist Surgeon Romeo Apple Anesthesia regional Bier block Findings compressed median nerve with discoloration the nerve was gray in color and flat in appearance no space-occupying lesions were seen  Indications for procedure failed conservative treatment of carpal tunnel syndrome  6 informed consent was done in the office.  The patient was identified in the preop area the right wrist was confirmed as a surgical site marked the chart was updated  The patient was taken to surgery gram of vancomycin was started secondary to penicillin allergy  After successful Bier block the right upper extremity was prepped and draped with sterile technique. The timeout procedure was completed and all parties involved were in agreement that we were doing a right carpal tunnel release  In incision was made over the carpal tunnel and taken down to the palmar fascia of the distal aspect of the carpal tunnel was identified blunt dissection and a blood measurement was passed beneath the ligament  The transverse carpal ligament was then released  The carpal tunnel contents were inspected found to be free of any space-occupying lesions. There was some adhesions around the median nerve and the nerve was flattened and discolored with a gray appearance  The wound is irrigated and then closed with 3-0 nylon suture in interrupted fashion.  15 cc of plain Marcaine was placed around the wound and a sterile bandage was applied  The tourniquet was released and had good color and the patient was taken to the recovery room in stable condition  Followup is scheduled for September 10 patient will be released to home today.

## 2011-07-14 NOTE — Brief Op Note (Signed)
07/14/2011  8:22 AM  PATIENT:  [name]  50 y.o. female  PRE-OPERATIVE DIAGNOSIS:  Carpal Tunnel Syndrome Right Wrist  POST-OPERATIVE DIAGNOSIS:  Carpal Tunnel Syndrome Right Wrist  PROCEDURE:  Procedure(s):RIGHT CARPAL TUNNEL RELEASE  FINDINGS: MEDIAN NERVE COMPRESSION, DISCOLORATION  SURGEON:  Surgeon(s): Fuller Canada, MD  PHYSICIAN ASSISTANT:   ASSISTANTS: none   ANESTHESIA:   regional  ESTIMATED BLOOD LOSS: * No blood loss amount entered *   BLOOD ADMINISTERED:none  DRAINS: none   LOCAL MEDICATIONS USED:  MARCAINE 15 CC  SPECIMEN:  No Specimen  DISPOSITION OF SPECIMEN:  N/A  COUNTS:  YES  TOURNIQUET:   Total Tourniquet Time Documented: Upper Arm (Right) - 26 minutes  DICTATION #:   PLAN OF CARE: PACU THEN HOME   PATIENT DISPOSITION:  PACU - hemodynamically stable.   Delay start of Pharmacological VTE agent (>24hrs) due to surgical blood loss or risk of bleeding:  not applicable

## 2011-07-14 NOTE — Anesthesia Preprocedure Evaluation (Addendum)
Anesthesia Evaluation  Name, MR# and DOB Patient awake  General Assessment Comment  Reviewed: Allergy & Precautions, H&P , NPO status   History of Anesthesia Complications Negative for: history of anesthetic complications  Airway Mallampati: III  Neck ROM: Full    Dental  (+) Teeth Intact   Pulmonary  asthma  + decreased breath sounds  decreased breath sounds none    Cardiovascular hypertension, Pt. on medications Regular Normal    Neuro/Psych    (+) Anxiety, Depression,    GI/Hepatic/Renal        GERD Medicated and Controlled     Endo/Other  (+) Diabetes mellitus-, Well Controlled, Type 2, Oral Hypoglycemic Agents,     Abdominal   Musculoskeletal   Hematology   Peds  Reproductive/Obstetrics    Anesthesia Other Findings             Anesthesia Physical Anesthesia Plan  ASA: III  Anesthesia Plan: Bier Block   Post-op Pain Management:    Induction:   Airway Management Planned: Nasal Cannula  Additional Equipment:   Intra-op Plan:   Post-operative Plan:   Informed Consent: I have reviewed the patients History and Physical, chart, labs and discussed the procedure including the risks, benefits and alternatives for the proposed anesthesia with the patient or authorized representative who has indicated his/her understanding and acceptance.     Plan Discussed with:   Anesthesia Plan Comments:         Anesthesia Quick Evaluation

## 2011-07-14 NOTE — Anesthesia Postprocedure Evaluation (Signed)
  Anesthesia Post-op Note  Patient: [name]  Procedure(s) Performed:  CARPAL TUNNEL RELEASE  Patient Location: PACU  Anesthesia Type: Bier block  Level of Consciousness: awake, alert  and oriented  Airway and Oxygen Therapy: Patient Spontanous Breathing  Post-op Pain: none  Post-op Assessment: Post-op Vital signs reviewed, Patient's Cardiovascular Status Stable and Respiratory Function Stable  Post-op Vital Signs: stable  Complications: No apparent anesthesia complications

## 2011-07-19 ENCOUNTER — Ambulatory Visit (INDEPENDENT_AMBULATORY_CARE_PROVIDER_SITE_OTHER): Payer: BC Managed Care – PPO | Admitting: Orthopedic Surgery

## 2011-07-19 DIAGNOSIS — Z9889 Other specified postprocedural states: Secondary | ICD-10-CM | POA: Insufficient documentation

## 2011-07-19 DIAGNOSIS — G5600 Carpal tunnel syndrome, unspecified upper limb: Secondary | ICD-10-CM

## 2011-07-19 DIAGNOSIS — G5601 Carpal tunnel syndrome, right upper limb: Secondary | ICD-10-CM

## 2011-07-19 NOTE — Progress Notes (Signed)
Postop visit #1  Date of surgery July 14, 2011.  Preoperative is carpal tunnel syndrome postoperative diagnosis was the same  Procedure open carpal tunnel release RIGHT wrist  Findings compressed median nerve or discoloration and flattening of the nerve.  Patient doing well has a little bit of numbness at the tip of her index and thumb which is a major improvement from his symptoms prior to surgery  The patient come back in a week to have the stitches removed.

## 2011-07-21 ENCOUNTER — Encounter (HOSPITAL_COMMUNITY): Payer: Self-pay | Admitting: Orthopedic Surgery

## 2011-07-26 ENCOUNTER — Ambulatory Visit (INDEPENDENT_AMBULATORY_CARE_PROVIDER_SITE_OTHER): Payer: BC Managed Care – PPO | Admitting: Orthopedic Surgery

## 2011-07-26 ENCOUNTER — Encounter: Payer: Self-pay | Admitting: Orthopedic Surgery

## 2011-07-26 DIAGNOSIS — G5600 Carpal tunnel syndrome, unspecified upper limb: Secondary | ICD-10-CM

## 2011-07-26 DIAGNOSIS — G5601 Carpal tunnel syndrome, right upper limb: Secondary | ICD-10-CM

## 2011-07-26 DIAGNOSIS — Z9889 Other specified postprocedural states: Secondary | ICD-10-CM

## 2011-07-26 NOTE — Progress Notes (Signed)
Postoperative carpal tunnel release doing well incision clean just at the middle it seems like It may open up.  Steri-Strips applied to this area.  Clear dressing applied  Come back in 2 weeks

## 2011-08-05 LAB — CBC
HCT: 41.7
MCHC: 33.8
Platelets: 308
RDW: 13.4

## 2011-08-05 LAB — BASIC METABOLIC PANEL
BUN: 9
Chloride: 106
Glucose, Bld: 102 — ABNORMAL HIGH
Potassium: 4.2

## 2011-08-09 ENCOUNTER — Telehealth: Payer: Self-pay | Admitting: Orthopedic Surgery

## 2011-08-09 NOTE — Telephone Encounter (Signed)
Patient called to ask (1) can she replace the ace bandage with a large band-aid, as tape keeps coming off the bandage.  (2)  is there any reason, being post-op, that she should not get flu shot?  Her workplace is doing free flu shots tomorrow.  Her next scheduled appointment is Thursday 08/12/11.  Ph# [phone].

## 2011-08-10 NOTE — Telephone Encounter (Signed)
Called patient and advised to call back, i was returning her phone call

## 2011-08-11 ENCOUNTER — Encounter: Payer: Self-pay | Admitting: Orthopedic Surgery

## 2011-08-11 ENCOUNTER — Ambulatory Visit (INDEPENDENT_AMBULATORY_CARE_PROVIDER_SITE_OTHER): Payer: BC Managed Care – PPO | Admitting: Orthopedic Surgery

## 2011-08-11 VITALS — Ht 66.0 in | Wt 300.0 lb

## 2011-08-11 DIAGNOSIS — Z9889 Other specified postprocedural states: Secondary | ICD-10-CM

## 2011-08-11 DIAGNOSIS — G5600 Carpal tunnel syndrome, unspecified upper limb: Secondary | ICD-10-CM | POA: Insufficient documentation

## 2011-08-11 NOTE — Patient Instructions (Signed)
RTW OCT 29TH   OK TO DO NORMAL HOUSEHOLD ACTIVITY

## 2011-08-11 NOTE — Progress Notes (Signed)
RIGHT carpal tunnel release.  Date of surgery September 5.  Patient is doing well. Wound is closed. Full range of motion as been restored. She still has some tingling at the tip of the index and the thumb.  She has some weakness in the wrist and hand.  She will work on strengthening exercises and return to work October 29

## 2011-08-16 ENCOUNTER — Other Ambulatory Visit (HOSPITAL_COMMUNITY): Payer: Self-pay | Admitting: Family Medicine

## 2011-08-16 DIAGNOSIS — Z139 Encounter for screening, unspecified: Secondary | ICD-10-CM

## 2011-08-23 ENCOUNTER — Ambulatory Visit (HOSPITAL_COMMUNITY)
Admission: RE | Admit: 2011-08-23 | Discharge: 2011-08-23 | Disposition: A | Discharge status: Home / Self Care | Payer: BC Managed Care – PPO | Source: Ambulatory Visit | Attending: Family Medicine | Admitting: Family Medicine

## 2011-08-23 DIAGNOSIS — Z1231 Encounter for screening mammogram for malignant neoplasm of breast: Secondary | ICD-10-CM | POA: Insufficient documentation

## 2011-08-23 DIAGNOSIS — Z139 Encounter for screening, unspecified: Secondary | ICD-10-CM

## 2011-08-25 ENCOUNTER — Other Ambulatory Visit (HOSPITAL_COMMUNITY): Payer: Self-pay | Admitting: Physician Assistant

## 2011-08-25 DIAGNOSIS — N6459 Other signs and symptoms in breast: Secondary | ICD-10-CM

## 2011-09-01 ENCOUNTER — Other Ambulatory Visit: Payer: Self-pay | Admitting: Family Medicine

## 2011-09-01 DIAGNOSIS — R928 Other abnormal and inconclusive findings on diagnostic imaging of breast: Secondary | ICD-10-CM

## 2011-09-29 ENCOUNTER — Ambulatory Visit (HOSPITAL_COMMUNITY)
Admission: RE | Admit: 2011-09-29 | Discharge: 2011-09-29 | Disposition: A | Discharge status: Home / Self Care | Payer: BC Managed Care – PPO | Source: Ambulatory Visit | Attending: Family Medicine | Admitting: Family Medicine

## 2011-09-29 DIAGNOSIS — R928 Other abnormal and inconclusive findings on diagnostic imaging of breast: Secondary | ICD-10-CM | POA: Insufficient documentation

## 2011-11-15 ENCOUNTER — Telehealth: Payer: Self-pay | Admitting: Orthopedic Surgery

## 2011-11-15 NOTE — Telephone Encounter (Signed)
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  This is causing her right hand to hurt badly.  She is asking if she can get a note stating that she is not to do any job that requires any pulling with this hand.  Please advise. Can leave a message on either home or cell phone

## 2011-11-18 NOTE — Telephone Encounter (Signed)
No   Carpal tunnel does not mean you can t pull with the hand   i suggest she see the companies physician to discuss work related issues with the hand

## 2011-11-18 NOTE — Telephone Encounter (Signed)
Left a message for her to call me back.

## 2011-11-18 NOTE — Telephone Encounter (Signed)
Patient  Advised

## 2011-12-28 ENCOUNTER — Telehealth: Payer: Self-pay

## 2011-12-28 NOTE — Telephone Encounter (Signed)
Pt had last colonoscopy by Dr.Jenkins on 06/17/2009. Recommended the next one in 3 years (which would be due in 06/2012.  Called and [facility] for pt to call.

## 2012-01-06 NOTE — Telephone Encounter (Signed)
Letter mailed for pt to call and schedule colonoscopy.  

## 2012-01-17 ENCOUNTER — Telehealth: Payer: Self-pay

## 2012-01-17 NOTE — Telephone Encounter (Signed)
Pt was referred from [facility] for colonoscopy. Last one was 06/2009 by Dr. Lovell Sheehan. Had tubular adenoma. Due in 06/2012. She would like to have done in July week of 4th when she is out for vacation. She will need OV prior to colonoscopy. She is aware that we will call in May or early June to schedule appt and then get scheduled for the week of 4th if possible. Will send letter to PCP and inform. ( Letter to Dr. Phillips Odor).

## 2012-03-02 ENCOUNTER — Encounter: Payer: Self-pay | Admitting: Orthopedic Surgery

## 2012-03-02 ENCOUNTER — Ambulatory Visit (INDEPENDENT_AMBULATORY_CARE_PROVIDER_SITE_OTHER): Payer: BC Managed Care – PPO | Admitting: Orthopedic Surgery

## 2012-03-02 VITALS — Wt 285.0 lb

## 2012-03-02 DIAGNOSIS — M6530 Trigger finger, unspecified finger: Secondary | ICD-10-CM

## 2012-03-02 DIAGNOSIS — M65311 Trigger thumb, right thumb: Secondary | ICD-10-CM

## 2012-03-02 NOTE — Progress Notes (Signed)
Patient ID: [name], female   DOB: 10-14-1961, 51 y.o.   MRN: [id number] Chief Complaint  Patient presents with  . Hand Pain    right thumb trigger finger since mid Feb.    Pain RIGHT thumb with slipping in and out of joint  This pain started sometime in February, so she would locking, clicking, and dull, throbbing constant pain and catching of the RIGHT thumb.  Review of systems negative. Status post carpal tunnel release, RIGHT upper extremity.  Review of systems related joint pain, stiffness, muscle, pain, numbness, tingling.  RIGHT thumb.  Tenderness and swelling over the A1 pulley, clicking and popping with normal range of motion. Joint stable. Strength of the flexor tendon. Normal. Skin intact. Normal temperature and color and capillary refill.  Impression RIGHT trigger thumb.  Recommended injection.  Return to weeks to see if the injection. He is to be repeated  Procedure note trigger finger injection  Diagnosis trigger finger Postop diagnosis trigger finger Procedure injection of trigger finger Finger injected RIGHT thumb  Details of procedure: After verbal consent and timeout to confirm site the RIGHT thumb injected  with 1 cc of 40 mg of Depo-Medrol and 1 cc of 1% lidocaine  The procedure was tolerated well without complication

## 2012-03-02 NOTE — Patient Instructions (Signed)
You have received a steroid shot. 15% of patients experience increased pain at the injection site with in the next 24 hours. This is best treated with ice and tylenol extra strength 2 tabs every 8 hours. If you are still having pain please call the office.    

## 2012-03-16 ENCOUNTER — Encounter: Payer: Self-pay | Admitting: Orthopedic Surgery

## 2012-03-16 ENCOUNTER — Ambulatory Visit (INDEPENDENT_AMBULATORY_CARE_PROVIDER_SITE_OTHER): Payer: BC Managed Care – PPO | Admitting: Orthopedic Surgery

## 2012-03-16 VITALS — BP 106/78 | Ht 66.0 in | Wt 285.0 lb

## 2012-03-16 DIAGNOSIS — R52 Pain, unspecified: Secondary | ICD-10-CM

## 2012-03-16 MED ORDER — GABAPENTIN 100 MG PO CAPS: 300.0000 mg | ORAL_CAPSULE | Freq: Once | ORAL | Status: DC

## 2012-03-16 NOTE — Progress Notes (Signed)
Subjective:     Patient ID: [name], female   DOB: 1961/04/14, 51 y.o.   MRN: [id number] Chief Complaint  Patient presents with  . Follow-up    2 week recheck on right trigger thumb post injection.    HPI The thumb pain has decreased with some catching   Review of Systems Negative     Objective:   Physical Exam Normal flexion extension with some crepitation     Assessment:     Trigger thumb right improved     Plan:     As needed

## 2012-04-12 ENCOUNTER — Telehealth: Payer: Self-pay

## 2012-04-12 NOTE — Telephone Encounter (Signed)
Pt was referred by Dr. Phillips Odor for colonoscopy in 12/2011. Pt wanted to wait and have procedure done the week of July 4th. She needs OV prior to procedure. Last one by Dr. Lovell Sheehan 06/17/2009 and the next was recommended in 3 years. ( Pt had a tubular ademona )

## 2012-04-12 NOTE — Telephone Encounter (Signed)
Pt is scheduled for an office visit with Lorenza Burton, NP on 05/01/2012 at 11:00 AM. She wants her procedure the week of July 4th.

## 2012-05-01 ENCOUNTER — Other Ambulatory Visit: Payer: Self-pay | Admitting: Internal Medicine

## 2012-05-01 ENCOUNTER — Ambulatory Visit (INDEPENDENT_AMBULATORY_CARE_PROVIDER_SITE_OTHER): Payer: BC Managed Care – PPO | Admitting: Urgent Care

## 2012-05-01 ENCOUNTER — Encounter: Payer: Self-pay | Admitting: Urgent Care

## 2012-05-01 VITALS — BP 138/80 | HR 88 | Temp 96.6°F | Ht 67.5 in | Wt 300.8 lb

## 2012-05-01 DIAGNOSIS — D126 Benign neoplasm of colon, unspecified: Secondary | ICD-10-CM

## 2012-05-01 DIAGNOSIS — E119 Type 2 diabetes mellitus without complications: Secondary | ICD-10-CM | POA: Insufficient documentation

## 2012-05-01 MED ORDER — PEG 3350-KCL-NA BICARB-NACL 420 G PO SOLR: ORAL | Status: AC

## 2012-05-01 NOTE — Assessment & Plan Note (Signed)
[name] is a pleasant 51 y.o. female with history of tubular adenoma colon removed via Dr. Lovell Sheehan from ascending colon a last colonoscopy 2010. She is due for colonoscopy this year per his recommendations. She denies any significant ongoing GI concerns at this time.I have discussed risks & benefits which include, but are not limited to, bleeding, infection, perforation & drug reaction.  The patient agrees with this plan & written consent will be obtained.   She does have concern about adequate sedation. For this reason, she will be given Phenergan 25 mg IV 30 minutes prior to the procedure to help augment sedation.

## 2012-05-01 NOTE — Progress Notes (Signed)
Faxed to PCP

## 2012-05-01 NOTE — Progress Notes (Signed)
Referring Provider: Colette Ribas, MD Primary Care Physician:  Colette Ribas, MD Primary Gastroenterologist:  Dr. Jena Gauss  Chief Complaint  Patient presents with  . Colonoscopy    Hx of tubular adenoma of colon    HPI:  [name] is a 51 y.o. female here as a referral from Dr. Phillips Odor for eval of colonoscopy. She gives history of tubular adenoma of the colon removed 3 years ago by Dr. Lovell Sheehan. He had asked her to come back in in 3 years for surveillance colonoscopy. She gives recall of pain during the colonoscopy by Dr. Lovell Sheehan and felt she was not adequately sedated. She has occasional diarrhea since cholecystectomy, but this is rare. She also notes a transient left upper quadrant pain that occurs when she bends over at times. When she straightens up the pain immediately ceases. The pain lasts a matter of seconds. It is not associated with any nausea, vomiting, or change in bowel habits. Her weight has been stable and her appetite is fine.  History of chronic GERD well controlled on omeprazole 20 mg daily. She denies any dysphagia or odynophagia. She denies any rectal bleeding or melena.  Past Medical History  Diagnosis Date  . Diabetes mellitus     Type II  . Asthma   . HTN (hypertension)   . Restless leg syndrome   . Anxiety   . Depression   . Reflux   . Cholesterol serum elevated   . GERD (gastroesophageal reflux disease)   . Cancer     skin cancer of nose-2002  . Hyperlipidemia   . Tubular adenoma of colon 06/17/09    Lovell Sheehan  . Sleep apnea     Past Surgical History  Procedure Date  . Vaginal hysterectomy   . Gallbladder surgery   . Ganglion cyst excision hand x 2  . Skin cancer excision on nose  . Cholecystectomy 2009    aph-Ziegler  . Abdominal hysterectomy 87    vag hysterectomy-destefano  . Appendectomy     with hysterectomy  . Skin graft 98    right calf-destefano  . Carpal tunnel release 07/14/2011    Procedure: CARPAL TUNNEL RELEASE;  Surgeon:  Fuller Canada, MD;  Location: [facility];  Service: Orthopedics;  Laterality: Right;  . Colonoscopy 06/17/09    Jenkins-(adequate prep)sessile tubular adenoma, otherwise normal/tubular adenoma    Current Outpatient Prescriptions  Medication Sig Dispense Refill  . ALPRAZolam (XANAX) 0.5 MG tablet Take 0.5 mg by mouth 4 (four) times daily.        Marland Kitchen aspirin 81 MG tablet Take 81 mg by mouth daily.       Marland Kitchen buPROPion (WELLBUTRIN XL) 300 MG 24 hr tablet Take 300 mg by mouth daily.        . carboxymethylcellulose (REFRESH PLUS) 0.5 % SOLN 1 drop 3 (three) times daily as needed. For irritation       . citalopram (CELEXA) 40 MG tablet Take 40 mg by mouth daily.        . clotrimazole-betamethasone (LOTRISONE) cream Apply 1 application topically 2 (two) times daily as needed. For affected areas of rash       . cyclobenzaprine (FLEXERIL) 10 MG tablet Take 10 mg by mouth 3 (three) times daily as needed.        . fexofenadine (ALLEGRA) 180 MG tablet Take 180 mg by mouth daily.        Marland Kitchen gabapentin (NEURONTIN) 100 MG capsule Take 3 capsules (300 mg total) by mouth once.  90 capsule  1  . losartan-hydrochlorothiazide (HYZAAR) 100-25 MG per tablet Take 1 tablet by mouth daily.      . Multiple Vitamins-Minerals (MULTIVITAL) tablet Take 1 tablet by mouth daily.        Marland Kitchen omeprazole (PRILOSEC) 20 MG capsule Take 20 mg by mouth daily.        Marland Kitchen rOPINIRole (REQUIP) 1 MG tablet Take 1 mg by mouth at bedtime as needed. For restless leg syndrome       . sitaGLIPtan-metformin (JANUMET) 50-1000 MG per tablet Take 1 tablet by mouth 2 (two) times daily with a meal.        . zolpidem (AMBIEN) 10 MG tablet Take 10 mg by mouth at bedtime as needed. Takes one-half tablet at bedtime only as needed       Current Facility-Administered Medications  Medication Dose Route Frequency Provider Last Rate Last Dose  . methylPREDNISolone acetate (DEPO-MEDROL) injection 40 mg  40 mg Intra-articular Once Vickki Hearing, MD         Allergies as of 05/01/2012 - Review Complete 05/01/2012  Allergen Reaction Noted  . Clarithromycin  02/09/2011  . Penicillins  02/09/2011  . Sulfa drugs cross reactors  02/09/2011    Family History:There is no known family history of colorectal carcinoma , liver disease, or inflammatory bowel disease.  Problem Relation Age of Onset  . Arthritis    . Diabetes    . Anesthesia problems Neg Hx   . Hypotension Neg Hx   . Malignant hyperthermia Neg Hx   . Pseudochol deficiency Neg Hx     History   Social History  . Marital Status: Widowed    Spouse Name: N/A    Number of Children: N/A  . Years of Education: 12th grade   Occupational History  . sewing Location manager    Social History Main Topics  . Smoking status: Former Smoker -- 0.2 packs/day for 2 years    Types: Cigarettes    Quit date: 07/08/1974  . Smokeless tobacco: Not on file   Comment: Quit for 20 plus years  . Alcohol Use: No  . Drug Use: No  . Sexually Active: Yes    Birth Control/ Protection: Surgical  Review of Systems: Gen: Positive for sleep apnea. Denies any fever, chills, sweats, anorexia, fatigue, weakness, malaise, weight loss. CV: Denies chest pain, angina, palpitations, syncope, orthopnea, PND, peripheral edema, and claudication. Resp: Denies dyspnea at rest, dyspnea with exercise, cough, sputum, wheezing, coughing up blood, and pleurisy. GI: Denies vomiting blood, jaundice, and fecal incontinence.    GU : Denies urinary burning, blood in urine, urinary frequency, urinary hesitancy, nocturnal urination, and urinary incontinence. MS: Denies joint pain, limitation of movement, and swelling, stiffness, low back pain, extremity pain. Denies muscle weakness, cramps, atrophy.  Derm: Denies rash, itching, dry skin, hives, moles, warts, or unhealing ulcers.  Psych: Denies depression, anxiety, memory loss, suicidal ideation, hallucinations, paranoia, and confusion. Heme: Denies bruising, bleeding, and  enlarged lymph nodes. Neuro:  Denies any headaches, dizziness, paresthesias. Endo:  Denies any problems with DM, thyroid, adrenal function.  Physical Exam: BP 138/80  Pulse 88  Temp 96.6 F (35.9 C) (Tympanic)  Ht 5' 7.5" (1.715 m)  Wt 300 lb 12.8 oz (136.442 kg)  BMI 46.42 kg/m2 General:   Alert,  Well-developed, obese, pleasant and cooperative in NAD Head:  Normocephalic and atraumatic. Eyes:  Sclera clear, no icterus.   Conjunctiva pink. Ears:  Normal auditory acuity. Nose:  No deformity, discharge, or  lesions. Mouth:  No deformity or lesions,oropharynx pink & moist. Neck:  Supple; no masses or thyromegaly. Lungs:  Clear throughout to auscultation.   No wheezes, crackles, or rhonchi. No acute distress. Heart:  Regular rate and rhythm; no murmurs, clicks, rubs,  or gallops. Abdomen:  Obese.  Normal bowel sounds.  No bruits.  Soft, non-tender and non-distended without masses, hepatosplenomegaly or hernias noted.  No guarding or rebound tenderness.  Exam is limited given patient's body habitus. Rectal:  Deferred. Msk:  Symmetrical without gross deformities. Normal posture. Pulses:  Normal pulses noted. Extremities:  No clubbing or edema. Neurologic:  Alert and oriented x4;  grossly normal neurologically. Skin:  Intact without significant lesions or rashes. Lymph Nodes:  No significant cervical adenopathy. Psych:  Alert and cooperative. Normal mood and affect.

## 2012-05-01 NOTE — Patient Instructions (Addendum)
You need a colonoscopy with Dr. Jena Gauss Take half your Janumet the day of your prep (day before your procedure).  Do not take your diabetes medicine (Janumet) the day of your procedure until after your procedure. If you continue to have episodes of left sided abdominal pain or have severe pain, please call as for further evaluation.

## 2012-05-01 NOTE — Assessment & Plan Note (Signed)
Take 1/2 Janumet day before procedure. Hold diabetes medicines the day of the procedure until after procedure.

## 2012-05-10 ENCOUNTER — Encounter (HOSPITAL_COMMUNITY): Payer: Self-pay | Admitting: Pharmacy Technician

## 2012-05-15 ENCOUNTER — Telehealth: Payer: Self-pay | Admitting: Internal Medicine

## 2012-05-15 NOTE — Telephone Encounter (Signed)
Patient has to cancel her procedure due to insurance cancellation and she will call us back when she gets her insurance straightened out

## 2012-05-22 ENCOUNTER — Encounter (HOSPITAL_COMMUNITY): Admission: RE | Payer: Self-pay | Source: Ambulatory Visit

## 2012-05-22 ENCOUNTER — Ambulatory Visit (HOSPITAL_COMMUNITY)
Admission: RE | Admit: 2012-05-22 | Payer: BC Managed Care – PPO | Source: Ambulatory Visit | Admitting: Internal Medicine

## 2012-05-22 SURGERY — COLONOSCOPY
Anesthesia: Moderate Sedation

## 2012-07-05 ENCOUNTER — Encounter: Payer: Self-pay | Admitting: Orthopedic Surgery

## 2012-07-05 ENCOUNTER — Ambulatory Visit (INDEPENDENT_AMBULATORY_CARE_PROVIDER_SITE_OTHER): Payer: BC Managed Care – PPO | Admitting: Orthopedic Surgery

## 2012-07-05 VITALS — BP 130/80 | Ht 67.5 in

## 2012-07-05 DIAGNOSIS — M6530 Trigger finger, unspecified finger: Secondary | ICD-10-CM | POA: Insufficient documentation

## 2012-07-05 MED ORDER — HYDROCODONE-ACETAMINOPHEN 5-325 MG PO TABS: 1.0000 | ORAL_TABLET | ORAL | Status: AC | PRN

## 2012-07-05 NOTE — Progress Notes (Signed)
Patient ID: [name], female   DOB: November 16, 1960, 51 y.o.   MRN: [id number] HPI Comments: The patient has had a history of triggering of the right thumb the symptoms have come back she has pain over the A1 pulley with clicking and locking  Hand Pain  The incident occurred more than 1 week ago. The incident occurred at home. There was no injury mechanism. The pain is present in the right hand. The quality of the pain is described as aching and stabbing. The pain does not radiate. The pain is moderate. The pain has been constant since the incident.   Review of systems normal at this time no paresthesias she does have some pain over the metacarpophalangeal joint  Physical Exam  Nursing note and vitals reviewed. Constitutional: She appears well-developed and well-nourished.   Right Hand Exam   Tenderness  Right hand tenderness location: A1 pulley right thumb.  Range of Motion  The patient has normal right wrist ROM.   Muscle Strength  Wrist Extension: 5/5  Wrist Flexion: 5/5  Grip: 5/5   Other  Erythema: absent Scars: absent Sensation: normal Pulse: present  Comments:  Clicking and popping with normal flexion of the distal thumb flexor flexor pollicis longus.     trigger finger right thumb  Inject right thumb  If no improvement or recurrence in less than 3 months recommend A1 pulley release  Norco 5 mg every 4 when necessary pain #30 in case she has pain like she did last time after her injection however should not need any other medication for pain after that

## 2012-07-05 NOTE — Patient Instructions (Addendum)
You have received a steroid shot. 15% of patients experience increased pain at the injection site with in the next 24 hours. This is best treated with ice and tylenol extra strength 2 tabs every 8 hours. If you are still having pain please call the office.   Trigger Finger Trigger finger (digital tendinitis and stenosing tenosynovitis) is a common disorder that causes an often painful catching of the fingers or thumb. It occurs as a clicking, snapping or locking of a finger in the palm of the hand. The reason for this is that there is a problem with the tendons which flex the fingers sliding smoothly through their sheaths. The cause of this may be inflammation of the tendon and sheath, or from a thickening or nodule in the tendon. The condition may occur in any finger or a couple fingers at the same time. The cause may be overuse while doing the same activity over and over again with your hands.   Tendons are the tough cords that connect the muscles to bones. Muscles and tendons are part of the system which allows your body to move. When muscles contract in the forearm on the palm side, they pull the tendons toward the elbow and cause the fingers and thumb to bend (flex) toward the palm. These are the flexor tendons. The tendons slide through a slippery smooth membrane (synovium) which is called the tendon sheath. The sheaths have areas of tough fibrous tissues surrounding them which hold the tendons close to the bone. These are called pulleys because they work like a pulley. The first pulley is in the palm of the hand near the crease which runs across your palm. If the area of the tendon thickening is near the pulley, the tendon cannot slide smoothly through the pulley and this causes the trigger finger. The finger may lock with the finger curled or suddenly straighten out with a snap. This is more common in patients with rheumatoid arthritis and diabetes. Left untreated, the condition may get worse to the  point where the finger becomes locked in flexion, like making a fist, or less commonly locked with the finger straightened out. DIAGNOSIS   Your caregiver will easily make this diagnosis on examination. TREATMENT    Splinting for 6 to 8 weeks of time may be helpful. Use the splints as your caregiver suggests.   Heat used for twenty minutes at least four times a day followed by ice packs for twenty minutes unless directed otherwise by your caregiver may be helpful. If you find either heat or cold seems to be making the problem worse, quit using them and ask your caregiver for directions.   Cortisone injections along with splinting may speed up recovery. Several injections may be required. Cortisone may give relief after one injection.   Only take over-the-counter or prescription medicines for pain, discomfort, or fever as directed by your caregiver.   Surgery is another treatment that may be used if conservative treatments using injection and splinting does not work. Surgery can be minor without incisions (a cut does not have to be made) and can be done with a needle through the skin. No stitches are needed and most patients may return to work the same day.   Other surgical choices involve an open procedure where the surgeon opens the hand through a small incision (cut) and cuts the pulley so the tendon can again slide smoothly. Your hand will still work fine. This small operation requires stitches and the recovery will   be a little longer and the incisions will need to be protected until completely healed. You may have to limit your activities for up to 6 months.   Occupational or hand therapy may be required if there is stiffness remaining in the finger.  RISKS AND COMPLICATIONS Complications are uncommon but some problems that may occur are:  Recurrence of the trigger finger. This does not mean that the surgery was not well done. It simply means that you may have formed scar tissue following  surgery that causes the problem to reoccur.   Infection which could ruin the results of the surgery and can result in a finger which is frozen and can not move normally.   Nerve injury is possible which could result in permanent numbness of one or more fingers.  CARE AFTER SURGERY  Elevate your hand above your heart and use ice as instructed.   Follow instructions regarding finger motion/exercise.   Keep the surgical wound dry for at least 48 hrs or longer if instructed.   Keep your follow-up appointments.   Return to work and normal activities as instructed.  SEEK IMMEDIATE MEDICAL CARE IF:   Your problems are getting worse or you do not obtain relief from the treatment. Document Released: 08/14/2004 Document Revised: 10/14/2011 Document Reviewed: 04/08/2009 [employer] Patient Information 2012 [employer]. 

## 2012-10-02 ENCOUNTER — Other Ambulatory Visit (HOSPITAL_COMMUNITY): Payer: Self-pay | Admitting: Internal Medicine

## 2012-10-02 DIAGNOSIS — Z139 Encounter for screening, unspecified: Secondary | ICD-10-CM

## 2012-10-09 ENCOUNTER — Ambulatory Visit (HOSPITAL_COMMUNITY): Payer: BC Managed Care – PPO

## 2012-10-10 ENCOUNTER — Ambulatory Visit (HOSPITAL_COMMUNITY): Payer: BC Managed Care – PPO

## 2012-10-12 ENCOUNTER — Ambulatory Visit (HOSPITAL_COMMUNITY)
Admission: RE | Admit: 2012-10-12 | Discharge: 2012-10-12 | Disposition: A | Discharge status: Home / Self Care | Payer: BC Managed Care – PPO | Source: Ambulatory Visit | Attending: Internal Medicine | Admitting: Internal Medicine

## 2012-10-12 DIAGNOSIS — Z139 Encounter for screening, unspecified: Secondary | ICD-10-CM

## 2012-10-12 DIAGNOSIS — Z1231 Encounter for screening mammogram for malignant neoplasm of breast: Secondary | ICD-10-CM | POA: Insufficient documentation

## 2012-12-13 ENCOUNTER — Encounter (HOSPITAL_COMMUNITY): Payer: Self-pay | Admitting: Pharmacy Technician

## 2012-12-13 ENCOUNTER — Encounter: Payer: Self-pay | Admitting: Orthopedic Surgery

## 2012-12-13 ENCOUNTER — Telehealth: Payer: Self-pay | Admitting: Orthopedic Surgery

## 2012-12-13 ENCOUNTER — Ambulatory Visit (INDEPENDENT_AMBULATORY_CARE_PROVIDER_SITE_OTHER): Payer: BC Managed Care – PPO | Admitting: Orthopedic Surgery

## 2012-12-13 VITALS — BP 140/100 | Ht 67.0 in | Wt 300.0 lb

## 2012-12-13 DIAGNOSIS — M6530 Trigger finger, unspecified finger: Secondary | ICD-10-CM

## 2012-12-13 NOTE — Telephone Encounter (Signed)
Contacted [employer], ph# [phone], regarding out-patient surgery scheduled for 12/18/12 at [facility] [place], [place] [address].  Per Loanne Drilling, in care management department, no pre-authorization is required for out-patient procedures, subject to the insurer's review at time of claim submission.  She has also verified coverage.  Patient had relayed that her coverage is now under [employer] with this insurer.  [employer] verification automated response system at same ph# above indicates coverage is active, effective dates 12/09/12 through 12/08/13.  Her name, today's date 12/13/12, 1:04p.m.

## 2012-12-13 NOTE — H&P (Signed)
[name] is an 51 y.o. female.   Chief Complaint: Right thumb triggering locking and pain HPI: 51 year-old female with a history of recurrent triggering in the right thumb status post 2 injections initially with good relief but now complains of pain over the A1 pulley which is aching associated with catching locking. Symptoms have become constant and worse with activity  Review of systems she reports weight gain no fever or chills denies visual disturbances difficulty swallowing fainting wheezing nausea vomiting urgency. Joint pain as described no skin changes no gait disturbance denies hallucination easy bleeding excessive thirst or food reaction    Past Medical History  Diagnosis Date  . Diabetes mellitus     Type II  . Asthma   . HTN (hypertension)   . Restless leg syndrome   . Anxiety   . Depression   . Reflux   . Cholesterol serum elevated   . GERD (gastroesophageal reflux disease)   . Cancer     skin cancer of nose-2002  . Hyperlipidemia   . Tubular adenoma of colon 06/17/09    Jenkins  . Sleep apnea     Past Surgical History  Procedure Date  . Vaginal hysterectomy   . Gallbladder surgery   . Ganglion cyst excision hand x 2  . Skin cancer excision on nose  . Cholecystectomy 2009    aph-Ziegler  . Abdominal hysterectomy 87    vag hysterectomy-destefano  . Appendectomy     with hysterectomy  . Skin graft 98    right calf-destefano  . Carpal tunnel release 07/14/2011    Procedure: CARPAL TUNNEL RELEASE;  Surgeon: Mikhael Hendriks, MD;  Location: [facility];  Service: Orthopedics;  Laterality: Right;  . Colonoscopy 06/17/09    Jenkins-(adequate prep)sessile tubular adenoma, otherwise normal/tubular adenoma    Family History  Problem Relation Age of Onset  . Arthritis    . Diabetes    . Anesthesia problems Neg Hx   . Hypotension Neg Hx   . Malignant hyperthermia Neg Hx   . Pseudochol deficiency Neg Hx    Social History:  reports that she quit smoking about  38 years ago. Her smoking use included Cigarettes. She has a .5 pack-year smoking history. She does not have any smokeless tobacco history on file. She reports that she does not drink alcohol or use illicit drugs.  Allergies:  Allergies  Allergen Reactions  . Penicillins Hives  . Clarithromycin Rash  . Sulfa Drugs Cross Reactors Rash     (Not in a hospital admission)  No results found for this or any previous visit (from the past 48 hour(s)). No results found.  ROS as noted above  Blood pressure 140/100, height 5' 7" (1.702 m), weight 300 lb (136.079 kg). Physical Exam   BP 140/100  Ht 5' 7" (1.702 m)  Wt 300 lb (136.079 kg)  BMI 46.99 kg/m2 General appearance other than obesity her appearance is normal she is oriented x3 she has a normal mood and affect him place without assistive devices  Lower extremities are without contracture subluxation atrophy tremor or malalignment  Her left upper extremity is normal  The right upper extremity shoulder elbow and wrist remain normal.  The right thumb is tender over the A1 pulley full range of motion somewhat mild weakness in pinch joint stability is normal scans intact there is triggering. Neurovascular exam remains normal no lymphadenopathy normal sensation no pathologic reflexes and normal balance  Assessment/Plan Right thumb tenosynovitis/trigger thumb  Recommend right   thumb A1 pulley release/trigger release.  Abhijot Straughter 12/13/2012, 11:01 AM    

## 2012-12-13 NOTE — Patient Instructions (Addendum)
Surgery right thumb trigger release.

## 2012-12-13 NOTE — Progress Notes (Signed)
Patient ID: [name], female   DOB: Jan 26, 1961, 52 y.o.   MRN: [id number] Chief Complaint  Patient presents with  . Hand Pain    Right trigger thumb    This patient complains of recurrent triggering, status post 2 injections, came in to discuss possible treatment options, including surgical treatment. After discussing this with her the more she does the worse. It gets as worse this time than the previous time. She would like to proceed with surgical release.  Please see the history and physical, which is incorporated by a reference for RIGHT trigger finger. Surgical release of A1 pulley  Discussed the following :   You have been scheduled for surgery.  All surgeries carry some risk.  Remember you always have the option of continued nonsurgical treatment. However in this situation the risks vs. the benefits favor surgery as the best treatment option. The risks of the surgery includes the following but is not limited to bleeding, infection,  nerve injury vascular injury or need for further surgery, continued pain.  Specific to this procedure the following risks and complications are rare but possible Stiffness Pain  Infection which may require several subsequent surgeriesnumbness

## 2012-12-15 ENCOUNTER — Encounter (HOSPITAL_COMMUNITY): Payer: Self-pay

## 2012-12-15 ENCOUNTER — Encounter (HOSPITAL_COMMUNITY)
Admission: RE | Admit: 2012-12-15 | Discharge: 2012-12-15 | Disposition: A | Discharge status: Home / Self Care | Payer: BC Managed Care – PPO | Source: Ambulatory Visit | Attending: Orthopedic Surgery | Admitting: Orthopedic Surgery

## 2012-12-15 ENCOUNTER — Other Ambulatory Visit: Payer: Self-pay

## 2012-12-15 LAB — SURGICAL PCR SCREEN: MRSA, PCR: NEGATIVE

## 2012-12-15 LAB — BASIC METABOLIC PANEL
BUN: 13 mg/dL (ref 6–23)
CO2: 28 mEq/L (ref 19–32)
Calcium: 9.6 mg/dL (ref 8.4–10.5)
Creatinine, Ser: 0.7 mg/dL (ref 0.50–1.10)
GFR calc Af Amer: >90 mL/min (ref >90)

## 2012-12-15 LAB — HEMOGLOBIN AND HEMATOCRIT, BLOOD
HCT: 42.5 % (ref 36.0–46.0)
Hemoglobin: 14.3 g/dL (ref 12.0–15.0)

## 2012-12-15 NOTE — Patient Instructions (Addendum)
[name]  12/15/2012   Your procedure is scheduled on:  Monday, 12/18/12  Report to [name] at 0920 AM.  Call this number if you have problems the morning of surgery: [phone]   Remember:   Do not eat food or drink liquids after midnight.   Take these medicines the morning of surgery with A SIP OF WATER: cymbalta, hyzaar, omeprazole, albuterol. You may take your xanax if you need to, but DO NOT take your medicines for diabetes (Januvia and Metformin) Please bring your inhaler with you.   Do not wear jewelry, make-up or nail polish.  Do not wear lotions, powders, or perfumes. You may wear deodorant.  Do not shave 48 hours prior to surgery. Men may shave face and neck.  Do not bring valuables to the hospital.  Contacts, dentures or bridgework may not be worn into surgery.  Leave suitcase in the car. After surgery it may be brought to your room.  For patients admitted to the hospital, checkout time is 11:00 AM the day of  discharge.   Patients discharged the day of surgery will not be allowed to drive  home.  Name and phone number of your driver: family  Special Instructions: Incentive Spirometry - Practice and bring it with you on the day of surgery. Shower using CHG 2 nights before surgery and the night before surgery.  If you shower the day of surgery use CHG.  Use special wash - you have one bottle of CHG for all showers.  You should use approximately 1/3 of the bottle for each shower.   Please read over the following fact sheets that you were given: Pain Booklet, Coughing and Deep Breathing, MRSA Information, Surgical Site Infection Prevention, Anesthesia Post-op Instructions and Care and Recovery After Surgery   Trigger Finger Trigger finger (digital tendinitis and stenosing tenosynovitis) is a common disorder that causes an often painful catching of the fingers or thumb. It occurs as a clicking, snapping or locking of a finger in the palm of the hand. The reason for this is  that there is a problem with the tendons which flex the fingers sliding smoothly through their sheaths. The cause of this may be inflammation of the tendon and sheath, or from a thickening or nodule in the tendon. The condition may occur in any finger or a couple fingers at the same time. The cause may be overuse while doing the same activity over and over again with your hands.  Tendons are the tough cords that connect the muscles to bones. Muscles and tendons are part of the system which allows your body to move. When muscles contract in the forearm on the palm side, they pull the tendons toward the elbow and cause the fingers and thumb to bend (flex) toward the palm. These are the flexor tendons. The tendons slide through a slippery smooth membrane (synovium) which is called the tendon sheath. The sheaths have areas of tough fibrous tissues surrounding them which hold the tendons close to the bone. These are called pulleys because they work like a pulley. The first pulley is in the palm of the hand near the crease which runs across your palm. If the area of the tendon thickening is near the pulley, the tendon cannot slide smoothly through the pulley and this causes the trigger finger. The finger may lock with the finger curled or suddenly straighten out with a snap. This is more common in patients with rheumatoid arthritis and diabetes. Left untreated, the  condition may get worse to the point where the finger becomes locked in flexion, like making a fist, or less commonly locked with the finger straightened out. DIAGNOSIS  Your caregiver will easily make this diagnosis on examination. TREATMENT   Splinting for 6 to 8 weeks of time may be helpful. Use the splints as your caregiver suggests.  Heat used for twenty minutes at least four times a day followed by ice packs for twenty minutes unless directed otherwise by your caregiver may be helpful. If you find either heat or cold seems to be making the  problem worse, quit using them and ask your caregiver for directions.  Cortisone injections along with splinting may speed up recovery. Several injections may be required. Cortisone may give relief after one injection.  Only take over-the-counter or prescription medicines for pain, discomfort, or fever as directed by your caregiver.  Surgery is another treatment that may be used if conservative treatments using injection and splinting does not work. Surgery can be minor without incisions (a cut does not have to be made) and can be done with a needle through the skin. No stitches are needed and most patients may return to work the same day.  Other surgical choices involve an open procedure where the surgeon opens the hand through a small incision (cut) and cuts the pulley so the tendon can again slide smoothly. Your hand will still work fine. This small operation requires stitches and the recovery will be a little longer and the incisions will need to be protected until completely healed. You may have to limit your activities for up to 6 months.  Occupational or hand therapy may be required if there is stiffness remaining in the finger. RISKS AND COMPLICATIONS Complications are uncommon but some problems that may occur are:  Recurrence of the trigger finger. This does not mean that the surgery was not well done. It simply means that you may have formed scar tissue following surgery that causes the problem to reoccur.  Infection which could ruin the results of the surgery and can result in a finger which is frozen and can not move normally.  Nerve injury is possible which could result in permanent numbness of one or more fingers. CARE AFTER SURGERY  Elevate your hand above your heart and use ice as instructed.  Follow instructions regarding finger motion/exercise.  Keep the surgical wound dry for at least 48 hrs or longer if instructed.  Keep your follow-up appointments.  Return to work  and normal activities as instructed. SEEK IMMEDIATE MEDICAL CARE IF:  Your problems are getting worse or you do not obtain relief from the treatment. Document Released: 08/14/2004 Document Revised: 01/17/2012 Document Reviewed: 04/08/2009 [facility] Patient Information 2013 [place], [place].  PATIENT INSTRUCTIONS POST-ANESTHESIA  IMMEDIATELY FOLLOWING SURGERY:  Do not drive or operate machinery for the first twenty four hours after surgery.  Do not make any important decisions for twenty four hours after surgery or while taking narcotic pain medications or sedatives.  If you develop intractable nausea and vomiting or a severe headache please notify your doctor immediately.  FOLLOW-UP:  Please make an appointment with your surgeon as instructed. You do not need to follow up with anesthesia unless specifically instructed to do so.  WOUND CARE INSTRUCTIONS (if applicable):  Keep a dry clean dressing on the anesthesia/puncture wound site if there is drainage.  Once the wound has quit draining you may leave it open to air.  Generally you should leave the bandage intact  for twenty four hours unless there is drainage.  If the epidural site drains for more than 36-48 hours please call the anesthesia department.  QUESTIONS?:  Please feel free to call your physician or the hospital operator if you have any questions, and they will be happy to assist you.

## 2012-12-18 ENCOUNTER — Encounter (HOSPITAL_COMMUNITY): Payer: Self-pay | Admitting: *Deleted

## 2012-12-18 ENCOUNTER — Encounter (HOSPITAL_COMMUNITY)
Admission: RE | Disposition: A | Discharge status: Home / Self Care | Payer: Self-pay | Source: Ambulatory Visit | Attending: Orthopedic Surgery

## 2012-12-18 ENCOUNTER — Ambulatory Visit (HOSPITAL_COMMUNITY)
Admission: RE | Admit: 2012-12-18 | Discharge: 2012-12-18 | Disposition: A | Discharge status: Home / Self Care | Payer: BC Managed Care – PPO | Source: Ambulatory Visit | Attending: Orthopedic Surgery | Admitting: Orthopedic Surgery

## 2012-12-18 ENCOUNTER — Ambulatory Visit (HOSPITAL_COMMUNITY): Payer: BC Managed Care – PPO | Admitting: Anesthesiology

## 2012-12-18 ENCOUNTER — Encounter (HOSPITAL_COMMUNITY): Payer: Self-pay | Admitting: Anesthesiology

## 2012-12-18 DIAGNOSIS — I10 Essential (primary) hypertension: Secondary | ICD-10-CM | POA: Insufficient documentation

## 2012-12-18 DIAGNOSIS — M6530 Trigger finger, unspecified finger: Secondary | ICD-10-CM | POA: Insufficient documentation

## 2012-12-18 DIAGNOSIS — Z01810 Encounter for preprocedural cardiovascular examination: Secondary | ICD-10-CM | POA: Insufficient documentation

## 2012-12-18 DIAGNOSIS — Z01812 Encounter for preprocedural laboratory examination: Secondary | ICD-10-CM | POA: Insufficient documentation

## 2012-12-18 HISTORY — PX: TRIGGER FINGER RELEASE: SHX641

## 2012-12-18 LAB — GLUCOSE, CAPILLARY: Glucose-Capillary: 103 mg/dL — ABNORMAL HIGH (ref 70–99)

## 2012-12-18 SURGERY — RELEASE, A1 PULLEY, FOR TRIGGER FINGER
Anesthesia: Regional | Site: Thumb | Laterality: Right | Wound class: Clean

## 2012-12-18 MED ORDER — MIDAZOLAM HCL 2 MG/2ML IJ SOLN
1.0000 mg | INTRAMUSCULAR | Status: DC | PRN
  Administered 2012-12-18: 2 mg via INTRAVENOUS

## 2012-12-18 MED ORDER — MIDAZOLAM HCL 5 MG/5ML IJ SOLN
INTRAMUSCULAR | Status: DC | PRN
  Administered 2012-12-18: 2 mg via INTRAVENOUS

## 2012-12-18 MED ORDER — HYDROCODONE-ACETAMINOPHEN 5-325 MG PO TABS: 1.0000 | ORAL_TABLET | ORAL | Status: DC | PRN

## 2012-12-18 MED ORDER — BUPIVACAINE HCL (PF) 0.5 % IJ SOLN
INTRAMUSCULAR | Status: DC | PRN
  Administered 2012-12-18: 10 mL

## 2012-12-18 MED ORDER — SODIUM CHLORIDE 0.9 % IJ SOLN
INTRAMUSCULAR | Status: AC
  Filled 2012-12-18: qty 10

## 2012-12-18 MED ORDER — MIDAZOLAM HCL 2 MG/2ML IJ SOLN
INTRAMUSCULAR | Status: AC
  Filled 2012-12-18: qty 2

## 2012-12-18 MED ORDER — SODIUM CHLORIDE 0.9 % IR SOLN
Status: DC | PRN
  Administered 2012-12-18: 1000 mL

## 2012-12-18 MED ORDER — ONDANSETRON HCL 4 MG/2ML IJ SOLN: 4.0000 mg | Freq: Once | INTRAMUSCULAR | Status: DC | PRN

## 2012-12-18 MED ORDER — BUPIVACAINE HCL (PF) 0.5 % IJ SOLN
INTRAMUSCULAR | Status: AC
  Filled 2012-12-18: qty 30

## 2012-12-18 MED ORDER — LACTATED RINGERS IV SOLN
INTRAVENOUS | Status: DC
  Administered 2012-12-18 (×2): via INTRAVENOUS

## 2012-12-18 MED ORDER — LIDOCAINE HCL (PF) 1 % IJ SOLN
INTRAMUSCULAR | Status: AC
  Filled 2012-12-18: qty 5

## 2012-12-18 MED ORDER — PROPOFOL 10 MG/ML IV EMUL
INTRAVENOUS | Status: AC
  Filled 2012-12-18: qty 20

## 2012-12-18 MED ORDER — FENTANYL CITRATE 0.05 MG/ML IJ SOLN
INTRAMUSCULAR | Status: AC
  Filled 2012-12-18: qty 2

## 2012-12-18 MED ORDER — FENTANYL CITRATE 0.05 MG/ML IJ SOLN: 25.0000 ug | INTRAMUSCULAR | Status: DC | PRN

## 2012-12-18 MED ORDER — VANCOMYCIN HCL 1000 MG IV SOLR
1000.0000 mg | INTRAVENOUS | Status: DC | PRN
  Administered 2012-12-18: 1000 mg via INTRAVENOUS

## 2012-12-18 MED ORDER — CHLORHEXIDINE GLUCONATE 4 % EX LIQD: 60.0000 mL | Freq: Once | CUTANEOUS | Status: DC

## 2012-12-18 MED ORDER — FENTANYL CITRATE 0.05 MG/ML IJ SOLN
INTRAMUSCULAR | Status: DC | PRN
  Administered 2012-12-18: 100 ug via INTRAVENOUS

## 2012-12-18 MED ORDER — VANCOMYCIN HCL 10 G IV SOLR
1500.0000 mg | INTRAVENOUS | Status: AC
  Administered 2012-12-18: 1500 mg via INTRAVENOUS
  Filled 2012-12-18: qty 1500

## 2012-12-18 MED ORDER — LIDOCAINE HCL (PF) 0.5 % IJ SOLN
INTRAMUSCULAR | Status: DC | PRN
  Administered 2012-12-18: 50 mL via INTRAVENOUS

## 2012-12-18 MED ORDER — PROPOFOL INFUSION 10 MG/ML OPTIME
INTRAVENOUS | Status: DC | PRN
  Administered 2012-12-18: 11:00:00 via INTRAVENOUS
  Administered 2012-12-18: 75 ug/kg/min via INTRAVENOUS

## 2012-12-18 SURGICAL SUPPLY — 37 items
BAG HAMPER (MISCELLANEOUS) ×2 IMPLANT
BANDAGE CONFORM 2  STR LF (GAUZE/BANDAGES/DRESSINGS) ×2 IMPLANT
BANDAGE ELASTIC 2 VELCRO NS LF (GAUZE/BANDAGES/DRESSINGS) ×2 IMPLANT
BANDAGE ESMARK 4X12 BL STRL LF (DISPOSABLE) ×1 IMPLANT
BLADE MINI RND TIP GREEN BEAV (BLADE) IMPLANT
BNDG CMPR 12X4 ELC STRL LF (DISPOSABLE) ×1
BNDG ESMARK 4X12 BLUE STRL LF (DISPOSABLE) ×2
CHLORAPREP W/TINT 26ML (MISCELLANEOUS) ×2 IMPLANT
CLOTH BEACON ORANGE TIMEOUT ST (SAFETY) ×2 IMPLANT
COVER LIGHT HANDLE STERIS (MISCELLANEOUS) ×4 IMPLANT
CUFF TOURNIQUET SINGLE 18IN (TOURNIQUET CUFF) ×2 IMPLANT
CUFF TOURNIQUET SINGLE 24IN (TOURNIQUET CUFF) IMPLANT
DECANTER SPIKE VIAL GLASS SM (MISCELLANEOUS) ×2 IMPLANT
DRSG XEROFORM 1X8 (GAUZE/BANDAGES/DRESSINGS) ×2 IMPLANT
ELECT NEEDLE TIP 2.8 STRL (NEEDLE) ×2 IMPLANT
ELECT REM PT RETURN 9FT ADLT (ELECTROSURGICAL) ×2
ELECTRODE REM PT RTRN 9FT ADLT (ELECTROSURGICAL) ×1 IMPLANT
GAUZE KERLIX 2  STERILE LF (GAUZE/BANDAGES/DRESSINGS) ×2 IMPLANT
GLOVE BIOGEL PI IND STRL 7.0 (GLOVE) ×2 IMPLANT
GLOVE BIOGEL PI INDICATOR 7.0 (GLOVE) ×2
GLOVE ECLIPSE 6.5 STRL STRAW (GLOVE) ×2 IMPLANT
GLOVE SKINSENSE NS SZ8.0 LF (GLOVE) ×1
GLOVE SKINSENSE STRL SZ8.0 LF (GLOVE) ×1 IMPLANT
GLOVE SS N UNI LF 8.5 STRL (GLOVE) ×2 IMPLANT
GOWN STRL REIN XL XLG (GOWN DISPOSABLE) ×4 IMPLANT
HAND ALUMI XLG (SOFTGOODS) ×2 IMPLANT
KIT ROOM TURNOVER APOR (KITS) ×2 IMPLANT
MANIFOLD NEPTUNE II (INSTRUMENTS) ×2 IMPLANT
NEEDLE HYPO 21X1.5 SAFETY (NEEDLE) ×2 IMPLANT
NS IRRIG 1000ML POUR BTL (IV SOLUTION) ×2 IMPLANT
PACK BASIC LIMB (CUSTOM PROCEDURE TRAY) ×2 IMPLANT
PAD ARMBOARD 7.5X6 YLW CONV (MISCELLANEOUS) ×2 IMPLANT
SET BASIN LINEN APH (SET/KITS/TRAYS/PACK) ×2 IMPLANT
SPONGE GAUZE 2X2 8PLY STRL LF (GAUZE/BANDAGES/DRESSINGS) ×2 IMPLANT
SUT ETHILON 3 0 FSL (SUTURE) ×2 IMPLANT
SUT PLAIN 3 0 SH 27IN (SUTURE) IMPLANT
SYR CONTROL 10ML LL (SYRINGE) ×2 IMPLANT

## 2012-12-18 NOTE — Op Note (Signed)
12/18/2012  11:14 AM  PATIENT:  [name]  52 y.o. female  PRE-OPERATIVE DIAGNOSIS:  right trigger thumb  POST-OPERATIVE DIAGNOSIS:  right trigger thumb  PROCEDURE:  Procedure(s): RELEASE TRIGGER FINGER/A-1 PULLEY; right thumb (Right)  Operative findings Stenotic A1 Pulley right thumb   Details of procedure  The surgical site was confirmed and marked in the preop holding area. The chart was reviewed and updated. The patient taken the operating room for regional anesthetic. The supine position with an armboard the right upper extremity was prepped and draped sterilely and regional anesthesia was administered.  A transverse incision was made at the proximal metacarpophalangeal joint crease. Blunt dissection was carried out to the digital nerve was identified. This was protected with a retractor.  The stenotic A1 pulley was released. This allowed easy flexion extension of the right thumb. There was some degeneration of the tendon but the tendons were intact.  There was irrigated and closed with 3-0 nylon interrupted sutures. We injected some Marcaine for postoperative anesthesia  Sterile dressing was applied. Tourniquet was released and had good color and capillary refill.    SURGEON:  Surgeon(s) and Role:    * Vickki Hearing, MD - Primary  PHYSICIAN ASSISTANT:   ASSISTANTS: none   ANESTHESIA:   regional  EBL:  Total I/O In: 250 [I.V.:250] Out: 0   BLOOD ADMINISTERED:none  DRAINS: none   LOCAL MEDICATIONS USED:  MARCAINE     SPECIMEN:  No Specimen  DISPOSITION OF SPECIMEN:  N/A  COUNTS:  YES  TOURNIQUET:   Total Tourniquet Time Documented: Upper Arm (Right) - 30 minutes Total: Upper Arm (Right) - 30 minutes   DICTATION: .Reubin Milan Dictation  PLAN OF CARE: Discharge to home after PACU  PATIENT DISPOSITION:  PACU - hemodynamically stable.   Delay start of Pharmacological VTE agent (>24hrs) due to surgical blood loss or risk of bleeding: not  applicable

## 2012-12-18 NOTE — Anesthesia Postprocedure Evaluation (Signed)
  Anesthesia Post-op Note  Patient: [name]  Procedure(s) Performed: Procedure(s): RELEASE TRIGGER FINGER/A-1 PULLEY; right thumb (Right)  Patient Location: PACU  Anesthesia Type:Bier block  Level of Consciousness: awake, alert  and oriented  Airway and Oxygen Therapy: Patient Spontanous Breathing  Post-op Pain: none  Post-op Assessment: Post-op Vital signs reviewed, Patient's Cardiovascular Status Stable, Respiratory Function Stable, Patent Airway and No signs of Nausea or vomiting  Post-op Vital Signs: Reviewed and stable  Complications: No apparent anesthesia complications

## 2012-12-18 NOTE — H&P (View-Only) (Signed)
[name] is an 52 y.o. female.   Chief Complaint: Right thumb triggering locking and pain HPI: 52 year-old female with a history of recurrent triggering in the right thumb status post 2 injections initially with good relief but now complains of pain over the A1 pulley which is aching associated with catching locking. Symptoms have become constant and worse with activity  Review of systems she reports weight gain no fever or chills denies visual disturbances difficulty swallowing fainting wheezing nausea vomiting urgency. Joint pain as described no skin changes no gait disturbance denies hallucination easy bleeding excessive thirst or food reaction    Past Medical History  Diagnosis Date  . Diabetes mellitus     Type II  . Asthma   . HTN (hypertension)   . Restless leg syndrome   . Anxiety   . Depression   . Reflux   . Cholesterol serum elevated   . GERD (gastroesophageal reflux disease)   . Cancer     skin cancer of nose-2002  . Hyperlipidemia   . Tubular adenoma of colon 06/17/09    Lovell Sheehan  . Sleep apnea     Past Surgical History  Procedure Date  . Vaginal hysterectomy   . Gallbladder surgery   . Ganglion cyst excision hand x 2  . Skin cancer excision on nose  . Cholecystectomy 2009    aph-Ziegler  . Abdominal hysterectomy 87    vag hysterectomy-destefano  . Appendectomy     with hysterectomy  . Skin graft 89    right calf-destefano  . Carpal tunnel release 07/14/2011    Procedure: CARPAL TUNNEL RELEASE;  Surgeon: Fuller Canada, MD;  Location: [facility];  Service: Orthopedics;  Laterality: Right;  . Colonoscopy 06/17/09    Jenkins-(adequate prep)sessile tubular adenoma, otherwise normal/tubular adenoma    Family History  Problem Relation Age of Onset  . Arthritis    . Diabetes    . Anesthesia problems Neg Hx   . Hypotension Neg Hx   . Malignant hyperthermia Neg Hx   . Pseudochol deficiency Neg Hx    Social History:  reports that she quit smoking about  38 years ago. Her smoking use included Cigarettes. She has a .5 pack-year smoking history. She does not have any smokeless tobacco history on file. She reports that she does not drink alcohol or use illicit drugs.  Allergies:  Allergies  Allergen Reactions  . Penicillins Hives  . Clarithromycin Rash  . Sulfa Drugs Cross Reactors Rash     (Not in a hospital admission)  No results found for this or any previous visit (from the past 48 hour(s)). No results found.  ROS as noted above  Blood pressure 140/100, height 5\' 7"  (1.702 m), weight 300 lb (136.079 kg). Physical Exam   BP 140/100  Ht 5\' 7"  (1.702 m)  Wt 300 lb (136.079 kg)  BMI 46.99 kg/m2 General appearance other than obesity her appearance is normal she is oriented x3 she has a normal mood and affect him place without assistive devices  Lower extremities are without contracture subluxation atrophy tremor or malalignment  Her left upper extremity is normal  The right upper extremity shoulder elbow and wrist remain normal.  The right thumb is tender over the A1 pulley full range of motion somewhat mild weakness in pinch joint stability is normal scans intact there is triggering. Neurovascular exam remains normal no lymphadenopathy normal sensation no pathologic reflexes and normal balance  Assessment/Plan Right thumb tenosynovitis/trigger thumb  Recommend right  thumb A1 pulley release/trigger release.  Fuller Canada 12/13/2012, 11:01 AM

## 2012-12-18 NOTE — Brief Op Note (Signed)
12/18/2012  11:14 AM  PATIENT:  Philomena Doheny  52 y.o. female  PRE-OPERATIVE DIAGNOSIS:  right trigger thumb  POST-OPERATIVE DIAGNOSIS:  right trigger thumb  PROCEDURE:  Procedure(s): RELEASE TRIGGER FINGER/A-1 PULLEY; right thumb (Right)  Stenotic A1 Pulley right thumb   SURGEON:  Surgeon(s) and Role:    * Vickki Hearing, MD - Primary  PHYSICIAN ASSISTANT:   ASSISTANTS: none   ANESTHESIA:   regional  EBL:  Total I/O In: 250 [I.V.:250] Out: 0   BLOOD ADMINISTERED:none  DRAINS: none   LOCAL MEDICATIONS USED:  MARCAINE     SPECIMEN:  No Specimen  DISPOSITION OF SPECIMEN:  N/A  COUNTS:  YES  TOURNIQUET:   Total Tourniquet Time Documented: Upper Arm (Right) - 30 minutes Total: Upper Arm (Right) - 30 minutes   DICTATION: .Reubin Milan Dictation  PLAN OF CARE: Discharge to home after PACU  PATIENT DISPOSITION:  PACU - hemodynamically stable.   Delay start of Pharmacological VTE agent (>24hrs) due to surgical blood loss or risk of bleeding: not applicable

## 2012-12-18 NOTE — Transfer of Care (Signed)
Immediate Anesthesia Transfer of Care Note  Patient: [name]  Procedure(s) Performed: Procedure(s): RELEASE TRIGGER FINGER/A-1 PULLEY; right thumb (Right)  Patient Location: PACU  Anesthesia Type:Bier block  Level of Consciousness: awake, alert  and oriented  Airway & Oxygen Therapy: Patient Spontanous Breathing  Post-op Assessment: Report given to PACU RN  Post vital signs: Reviewed and stable  Complications: No apparent anesthesia complications

## 2012-12-18 NOTE — Anesthesia Preprocedure Evaluation (Signed)
Anesthesia Evaluation  Patient identified by MRN, date of birth, ID band Patient awake    Reviewed: Allergy & Precautions, H&P , NPO status   History of Anesthesia Complications Negative for: history of anesthetic complications  Airway Mallampati: III  Neck ROM: Full    Dental  (+) Teeth Intact   Pulmonary asthma , sleep apnea ,    + decreased breath sounds      Cardiovascular hypertension, Pt. on medications Rhythm:Regular Rate:Normal     Neuro/Psych PSYCHIATRIC DISORDERS Anxiety Depression  Neuromuscular disease    GI/Hepatic GERD-  Medicated and Controlled,  Endo/Other  diabetes, Type 2, Oral Hypoglycemic AgentsMorbid obesity  Renal/GU      Musculoskeletal   Abdominal (+) + obese,  Abdomen: soft.    Peds  Hematology negative hematology ROS (+)   Anesthesia Other Findings   Reproductive/Obstetrics                           Anesthesia Physical Anesthesia Plan  ASA: III  Anesthesia Plan: Bier Block   Post-op Pain Management:    Induction: Intravenous  Airway Management Planned: Nasal Cannula  Additional Equipment:   Intra-op Plan:   Post-operative Plan:   Informed Consent: I have reviewed the patients History and Physical, chart, labs and discussed the procedure including the risks, benefits and alternatives for the proposed anesthesia with the patient or authorized representative who has indicated his/her understanding and acceptance.     Plan Discussed with: CRNA  Anesthesia Plan Comments:         Anesthesia Quick Evaluation

## 2012-12-18 NOTE — Preoperative (Signed)
Beta Blockers   Reason not to administer Beta Blockers:Not Applicable 

## 2012-12-18 NOTE — Addendum Note (Signed)
Addendum created 12/18/12 1142 by Franco Nones, CRNA   Modules edited: Anesthesia Responsible Staff

## 2012-12-18 NOTE — Anesthesia Procedure Notes (Addendum)
Anesthesia Regional Block:  Bier block (IV Regional)  Pre-Anesthetic Checklist: ,, timeout performed, Correct Patient, Correct Site, Correct Laterality, Correct Procedure,, site marked, surgical consent,, at surgeon's request  Laterality: Right     Needles:  Injection technique: Single-shot  Needle Type: Other      Needle Gauge: 22 and 22 G    Additional Needles: Bier block (IV Regional)  Nerve Stimulator or Paresthesia:   Additional Responses:  Pulse checked post tourniquet inflation. IV NSL discontinued post injection. Narrative:  Start time: 12/18/2012 10:43 AM  Performed by: Personally   Bier block (IV Regional) Anesthesia Regional Block:   Narrative:

## 2012-12-18 NOTE — Interval H&P Note (Signed)
History and Physical Interval Note:  12/18/2012 10:19 AM  [name]  has presented today for surgery, with the diagnosis of right trigger thumb  The various methods of treatment have been discussed with the patient and family. After consideration of risks, benefits and other options for treatment, the patient has consented to  Procedure(s): RELEASE TRIGGER FINGER/A-1 PULLEY (Right) as a surgical intervention .  The patient's history has been reviewed, patient examined, no change in status, stable for surgery.  I have reviewed the patient's chart and labs.  Questions were answered to the patient's satisfaction.     Fuller Canada

## 2012-12-19 ENCOUNTER — Encounter (HOSPITAL_COMMUNITY): Payer: Self-pay | Admitting: Orthopedic Surgery

## 2012-12-19 MED ORDER — LIDOCAINE HCL (PF) 0.5 % IJ SOLN
INTRAMUSCULAR | Status: AC
  Filled 2012-12-19: qty 50

## 2012-12-25 ENCOUNTER — Ambulatory Visit (INDEPENDENT_AMBULATORY_CARE_PROVIDER_SITE_OTHER): Payer: BC Managed Care – PPO | Admitting: Orthopedic Surgery

## 2012-12-25 VITALS — BP 142/90 | Ht 67.0 in | Wt 300.0 lb

## 2012-12-25 DIAGNOSIS — M6530 Trigger finger, unspecified finger: Secondary | ICD-10-CM

## 2012-12-25 NOTE — Progress Notes (Signed)
Patient ID: [name], female   DOB: 06/10/1961, 52 y.o.   MRN: [id number] Chief Complaint  Patient presents with  . Follow-up    Post op 1 Right thumb DOS 12/18/12    7 days Postoperative trigger thumb release. Doing well. Sutures removed comeback in 2 weeks continue flexion interphalangeal joint

## 2013-01-09 ENCOUNTER — Ambulatory Visit (INDEPENDENT_AMBULATORY_CARE_PROVIDER_SITE_OTHER): Payer: BC Managed Care – PPO | Admitting: Orthopedic Surgery

## 2013-01-09 VITALS — BP 160/108 | Ht 67.0 in | Wt 300.0 lb

## 2013-01-09 DIAGNOSIS — M65311 Trigger thumb, right thumb: Secondary | ICD-10-CM

## 2013-01-09 MED ORDER — CLINDAMYCIN HCL 150 MG PO CAPS: 150.0000 mg | ORAL_CAPSULE | Freq: Four times a day (QID) | ORAL | Status: DC

## 2013-01-09 MED ORDER — HYDROCODONE-ACETAMINOPHEN 5-325 MG PO TABS: 1.0000 | ORAL_TABLET | ORAL | Status: DC | PRN

## 2013-01-09 NOTE — Progress Notes (Signed)
Patient ID: [name], female   DOB: 06/12/1961, 52 y.o.   MRN: [id number] Chief Complaint  Patient presents with  . Follow-up    2 week recheck on right trigger thumb. DOS 12-18-12.    BP 160/108  Ht 5\' 7"  (1.702 m)  Wt 300 lb (136.079 kg)  BMI 46.98 kg/m2  Patient spur suture removal from her trigger finger routine followup doing well had a little drainage treated with peroxide  Wound looks clean, flexion is normal without clicking or popping  She is diabetic some placed on clindamycin for 10 days  Continue hydrocodone  Followup as needed

## 2013-01-09 NOTE — Patient Instructions (Signed)
activities as tolerated  Take antibiotics   Take pain medication as needed

## 2013-04-10 ENCOUNTER — Encounter: Payer: Self-pay | Admitting: Orthopedic Surgery

## 2013-04-10 ENCOUNTER — Ambulatory Visit (INDEPENDENT_AMBULATORY_CARE_PROVIDER_SITE_OTHER): Payer: BC Managed Care – PPO | Admitting: Orthopedic Surgery

## 2013-04-10 ENCOUNTER — Ambulatory Visit (INDEPENDENT_AMBULATORY_CARE_PROVIDER_SITE_OTHER): Payer: BC Managed Care – PPO

## 2013-04-10 VITALS — BP 128/90 | Ht 66.5 in | Wt 300.0 lb

## 2013-04-10 DIAGNOSIS — M542 Cervicalgia: Secondary | ICD-10-CM

## 2013-04-10 DIAGNOSIS — M47812 Spondylosis without myelopathy or radiculopathy, cervical region: Secondary | ICD-10-CM | POA: Insufficient documentation

## 2013-04-10 NOTE — Patient Instructions (Addendum)
Start therapy at [facility]   Neck pain may result from abnormalities in the soft tissues-the muscles, ligaments, and nerves-as well as in bones and disks of the spine. The most common causes of neck pain are soft-tissue abnormalities due to injury (a sprain) or prolonged wear and tear. In rare instances, infection or tumors may cause neck pain. In some people, neck problems may be the source of pain in the upper back, shoulders, or arms. Cervical Disk Degeneration (Spondylosis) The disk acts as a shock absorber between the bones in the neck. In cervical disk degeneration (which typically occurs in people age 50 years and older), the normal gelatin-like center of the disk degenerates and the space between the vertebrae narrows. As the disk space narrows, added stress is applied to the joints of the spine causing further wear and degenerative disease. The cervical disk may also protrude and put pressure on the spinal cord or nerve roots when the rim of the disk weakens. This is known as a herniated cervical disk.

## 2013-04-10 NOTE — Progress Notes (Signed)
  Subjective:    Patient ID: [name], female    DOB: Mar 03, 1961, 52 y.o.   MRN: [id number]  Shoulder Pain  The pain is present in the left shoulder and neck. This is a new problem. The current episode started more than 1 month ago. There has been no history of extremity trauma. The problem occurs intermittently. The problem has been unchanged. The quality of the pain is described as burning and dull (stabbing ). The pain is at a severity of 8/10. Associated symptoms include numbness, stiffness and tingling. Associated symptoms comments: Catching .      Review of Systems  Respiratory: Positive for cough, shortness of breath and wheezing.   Gastrointestinal: Positive for diarrhea.  Musculoskeletal: Positive for stiffness.  Neurological: Positive for tingling and numbness.  Hematological: Negative.   Psychiatric/Behavioral: The patient is nervous/anxious.        Objective:   Physical Exam  Constitutional: She is oriented to person, place, and time. She appears well-developed and well-nourished.  Obesity   Neck: Full passive range of motion without pain. Spinous process tenderness and muscular tenderness present. Decreased range of motion present.  Pain with left rotation and extension     Musculoskeletal:       Right shoulder: She exhibits normal range of motion, no tenderness, no bony tenderness, no swelling, no effusion, no deformity and no spasm.       Left shoulder: She exhibits normal range of motion, no tenderness, no bony tenderness, no swelling, no effusion, no deformity and no spasm.  Neurological: She is alert and oriented to person, place, and time. She has normal reflexes.  Skin: Skin is warm and dry.  Psychiatric: She has a normal mood and affect. Her behavior is normal. Judgment and thought content normal.          Assessment & Plan:  xrays show DJD   IM Depo 40 mg  PT

## 2013-04-18 ENCOUNTER — Ambulatory Visit (HOSPITAL_COMMUNITY)
Admission: RE | Admit: 2013-04-18 | Discharge: 2013-04-18 | Disposition: A | Discharge status: Home / Self Care | Payer: BC Managed Care – PPO | Source: Ambulatory Visit | Attending: Orthopedic Surgery | Admitting: Orthopedic Surgery

## 2013-04-18 DIAGNOSIS — IMO0001 Reserved for inherently not codable concepts without codable children: Secondary | ICD-10-CM | POA: Insufficient documentation

## 2013-04-18 DIAGNOSIS — M542 Cervicalgia: Secondary | ICD-10-CM | POA: Insufficient documentation

## 2013-04-18 DIAGNOSIS — E119 Type 2 diabetes mellitus without complications: Secondary | ICD-10-CM | POA: Insufficient documentation

## 2013-04-18 DIAGNOSIS — M47812 Spondylosis without myelopathy or radiculopathy, cervical region: Secondary | ICD-10-CM | POA: Diagnosis present

## 2013-04-24 NOTE — Evaluation (Signed)
Physical Therapy Evaluation  Patient Details  Name: [name] MRN: [id number] Date of Birth: [birth year]  Today's Date: 04/24/2013 Time: 1525-1600 PT Time Calculation (min): 35 min Charges: Evaluation: 1 TE: 1555-1600             Visit#: 1 of 3  Re-eval: 05/23/13 Assessment Diagnosis: Cervical/shoulder pain Next MD Visit: Dr. Romeo Apple - unscheudled  Past Medical History:  Past Medical History  Diagnosis Date  . Diabetes mellitus     Type II  . Asthma   . HTN (hypertension)   . Restless leg syndrome   . Anxiety   . Depression   . Reflux   . Cholesterol serum elevated   . GERD (gastroesophageal reflux disease)   . Cancer     skin cancer of nose-2002  . Hyperlipidemia   . Tubular adenoma of colon 06/17/09    Lovell Sheehan  . Sleep apnea    Past Surgical History:  Past Surgical History  Procedure Laterality Date  . Vaginal hysterectomy    . Gallbladder surgery    . Ganglion cyst excision  hand x 2  . Skin cancer excision  on nose  . Cholecystectomy  2009    aph-Ziegler  . Abdominal hysterectomy  89    vag hysterectomy-destefano  . Appendectomy      with hysterectomy  . Skin graft  98    right calf-destefano  . Carpal tunnel release  07/14/2011    Procedure: CARPAL TUNNEL RELEASE;  Surgeon: Fuller Canada, MD;  Location: [facility];  Service: Orthopedics;  Laterality: Right;  . Colonoscopy  06/17/09    Jenkins-(adequate prep)sessile tubular adenoma, otherwise normal/tubular adenoma  . Trigger finger release Right 12/18/2012    Procedure: RELEASE TRIGGER FINGER/A-1 PULLEY; right thumb;  Surgeon: Vickki Hearing, MD;  Location: [facility];  Service: Orthopedics;  Laterality: Right;    Subjective Symptoms/Limitations Symptoms: Significant PMH: BLE periphreal neuropathy Pertinent History: Pt is a 52 year old female referred to PT for neck pain.  She reports that the pain is in the Lt shoulder and scapular region which started back in [place].  Has a recent  non-productive cough for about 1 week.  She has been having radicular pain to her scapular region.  Limitations: Sitting;Reading How long can you sit comfortably?: 30 minutes  Patient Stated Goals: stretch her neck and relieve her pain.  Pain Assessment Currently in Pain?: Yes Pain Score:   8 Pain Location: Neck Pain Orientation: Left Pain Type: Acute pain;Chronic pain Pain Onset: More than a month ago Pain Frequency: Intermittent Pain Relieving Factors: sitting in a comfortably position, some OTC, takes hydrocodone for other injuries and does not take often due to effects Effect of Pain on Daily Activities: difficulty donning bra to twist, difficulty sitting  Balance Screening Balance Screen Has the patient fallen in the past 6 months: No Has the patient had a decrease in activity level because of a fear of falling? : Yes Is the patient reluctant to leave their home because of a fear of falling? : No  Prior Functioning  Prior Function Comments: Reading and word seach puzzle books  Assessment Cervical Assessment Cervical Assessment: Exceptions to [facility] Cervical AROM Cervical - Right Side Bend: Decreased 50% with increased pain Cervical - Left Side Bend: WNL Cervical - Right Rotation: WNL Cervical - Left Rotation: decreased 25% w/pain Palpation Palpation: pain and tenderness to to cervical region with fascial restrictions to scapular region  Mobility/Balance  Posture/Postural Control Posture/Postural Control: Postural limitations Postural Limitations:  upper cross syndrome   Exercise/Treatments Stretches Upper Trapezius Stretch: 1 rep;30 seconds Levator Stretch: 1 rep;30 seconds Seated Exercises Neck Retraction: 10 reps Shoulder Rolls: Backwards;10 reps Other Seated Exercise: scapular retraction x10   Physical Therapy Assessment and Plan PT Assessment and Plan Clinical Impression Statement: Pt is a 52 year old female referred to PT for cervical pain of insidious onset  with significant fascial restrictions.   Pt will benefit from skilled therapeutic intervention in order to improve on the following deficits: Pain;Decreased strength;Impaired perceived functional ability;Increased fascial restricitons;Decreased range of motion;Improper body mechanics Rehab Potential: Fair PT Frequency: Min 1X/week PT Duration:  (3 weeks) PT Treatment/Interventions: Therapeutic exercise;Patient/family education PT Plan: HEP to help with neck pain: x-v, w-backs, theraband, progress to prone activities.  Manual techniques as needed to cervical and suboccipital region     Goals Home Exercise Program Pt will Perform Home Exercise Program: Independently PT Goal: Perform Home Exercise Program - Progress: Goal set today  Problem List Patient Active Problem List   Diagnosis Date Noted  . Cervical spine arthritis 04/10/2013  . Neck pain on left side 04/10/2013  . Acquired trigger finger 07/05/2012  . Tubular adenoma of colon 05/01/2012  . Diabetes mellitus 05/01/2012  . Trigger thumb of right hand 03/02/2012  . CTS (carpal tunnel syndrome) 08/11/2011  . S/P carpal tunnel release 07/19/2011  . Carpal tunnel syndrome of right wrist 03/24/2011    General Behavior During Therapy: [facility] for tasks assessed/performed Cognition: [facility] for tasks performed PT Plan of Care PT Home Exercise Plan: See scanned report PT Patient Instructions: importance of posture, core strength and HEP Consulted and Agree with Plan of Care: Patient  Annett Fabian, MPT ATC 04/24/2013, 8:18 AM  Physician Documentation Your signature is required to indicate approval of the treatment plan as stated above.  Please sign and either send electronically or make a copy of this report for your files and return this physician signed original.   Please mark one 1.__approve of plan  2. ___approve of plan with the following conditions.   ______________________________                                                           _____________________ Physician Signature                                                                                                             Date

## 2013-04-25 ENCOUNTER — Ambulatory Visit (HOSPITAL_COMMUNITY)
Admission: RE | Admit: 2013-04-25 | Discharge: 2013-04-25 | Disposition: A | Discharge status: Home / Self Care | Payer: BC Managed Care – PPO | Source: Ambulatory Visit | Attending: Orthopedic Surgery | Admitting: Orthopedic Surgery

## 2013-04-25 DIAGNOSIS — M47812 Spondylosis without myelopathy or radiculopathy, cervical region: Secondary | ICD-10-CM

## 2013-04-25 DIAGNOSIS — M542 Cervicalgia: Secondary | ICD-10-CM

## 2013-04-25 NOTE — Progress Notes (Signed)
Physical Therapy Treatment Patient Details  Name: [name] MRN: [id number] Date of Birth: 1961/07/04  Today's Date: 04/25/2013 Time: 7829-5621 PT Time Calculation (min): 46 min Charge: TE 31' (0931-1002), Manual [phone])  Visit#: 2 of 4  Re-eval: 05/23/13    Subjective: Symptoms/Limitations Symptoms: Ptt reported compliance with HEP, stated neck pain increased at night. Pain Assessment Currently in Pain?: Yes Pain Score:   6 Pain Location: Neck Pain Orientation: Left  Objective   Exercise/Treatments Theraband Exercises Scapula Retraction: 5 reps;Green Shoulder Extension: 5 reps;Green Rows: 5 reps;Green Seated Exercises Neck Retraction: 10 reps;5 secs Cervical Rotation: 5 reps Lateral Flexion: 5 reps W Back: 10 reps Shoulder Rolls: Backwards;10 reps Other Seated Exercise: scapular retraction x10 Other Seated Exercise: Posture awareness x 5 minutes  Manual Therapy Manual Therapy: Myofascial release Myofascial Release: MFR to cervical region to reduce fascial restrictions L>R, suboccipitial release, manual cervical traction  Physical Therapy Assessment and Plan PT Assessment and Plan Clinical Impression Statement: Began POC for cervical and postural strengthening and manual techniques to reduce fascial restrictions.  Pt educated on importance of proper posture and instructed form.  Pt given HEP worksheet and green tband to continue postural strengthening at home.  Noted significant fascial restrictions to suboccipital region, able to reduce 30% with pain relief stated following.   PT Plan: Continue with current POC, continue with theraband for proper form and progress to prone activities when ready.  Manual techniques to cervical and suboccipital region.    Goals    Problem List Patient Active Problem List   Diagnosis Date Noted  . Cervical spine arthritis 04/10/2013  . Neck pain on left side 04/10/2013  . Acquired trigger finger 07/05/2012  .  Tubular adenoma of colon 05/01/2012  . Diabetes mellitus 05/01/2012  . Trigger thumb of right hand 03/02/2012  . CTS (carpal tunnel syndrome) 08/11/2011  . S/P carpal tunnel release 07/19/2011  . Carpal tunnel syndrome of right wrist 03/24/2011    PT - End of Session Activity Tolerance: Patient tolerated treatment well General Behavior During Therapy: WFL for tasks assessed/performed Cognition: WFL for tasks performed PT Plan of Care PT Home Exercise Plan: Tband and postural strengthening worksheet PT Patient Instructions: importance of posture  GP    Juel Burrow 04/25/2013, 11:58 AM

## 2013-05-02 ENCOUNTER — Ambulatory Visit (HOSPITAL_COMMUNITY)
Admission: RE | Admit: 2013-05-02 | Discharge: 2013-05-02 | Disposition: A | Discharge status: Home / Self Care | Payer: BC Managed Care – PPO | Source: Ambulatory Visit | Attending: Orthopedic Surgery | Admitting: Orthopedic Surgery

## 2013-05-02 NOTE — Progress Notes (Signed)
Physical Therapy Treatment Patient Details  Name: [name] MRN: [id number] Date of Birth: 08/31/1961  Today's Date: 05/02/2013 Time: 0832-0902 PT Time Calculation (min): 30 min  Visit#: 3 of 4  Re-eval: 05/23/13 Charges: Therex x 20' [phone]) Manual x 10' (0852-0902)   Subjective: Symptoms/Limitations Symptoms: Pt states that she continues to have increased pain at night.  Pain Assessment Currently in Pain?: Yes Pain Score:   6 Pain Location: Neck Pain Orientation: Left   Exercise/Treatments Theraband Exercises Scapula Retraction: 10 reps Shoulder Extension: 10 reps Rows: 10 reps Seated Exercises Neck Retraction: 10 reps;5 secs Cervical Rotation: 10 reps Lateral Flexion: 10 reps W Back: 10 reps Shoulder Rolls: 10 reps;Backwards   Physical Therapy Assessment and Plan PT Assessment and Plan Clinical Impression Statement: Pt requires multimodal cueing with scapular tband exercises to improve form and technique. Pt tends to compensate for rhomboid weakness with upper trapezius. Manual techniques completed to cervical musculature to decrease pain and tightness. Pt reports pain decrease to 4/10 at end of session. PT Plan: Reassess next session.     Problem List Patient Active Problem List   Diagnosis Date Noted  . Cervical spine arthritis 04/10/2013  . Neck pain on left side 04/10/2013  . Acquired trigger finger 07/05/2012  . Tubular adenoma of colon 05/01/2012  . Diabetes mellitus 05/01/2012  . Trigger thumb of right hand 03/02/2012  . CTS (carpal tunnel syndrome) 08/11/2011  . S/P carpal tunnel release 07/19/2011  . Carpal tunnel syndrome of right wrist 03/24/2011    PT - End of Session Activity Tolerance: Patient tolerated treatment well General Behavior During Therapy: [facility] for tasks assessed/performed Cognition: [facility] for tasks performed  Seth Bake, PTA  05/02/2013, 9:18 AM

## 2013-05-09 ENCOUNTER — Ambulatory Visit (HOSPITAL_COMMUNITY)
Admission: RE | Admit: 2013-05-09 | Discharge: 2013-05-09 | Disposition: A | Discharge status: Home / Self Care | Payer: BC Managed Care – PPO | Source: Ambulatory Visit | Attending: Orthopedic Surgery | Admitting: Orthopedic Surgery

## 2013-05-09 DIAGNOSIS — M542 Cervicalgia: Secondary | ICD-10-CM | POA: Insufficient documentation

## 2013-05-09 DIAGNOSIS — E119 Type 2 diabetes mellitus without complications: Secondary | ICD-10-CM | POA: Insufficient documentation

## 2013-05-09 DIAGNOSIS — IMO0001 Reserved for inherently not codable concepts without codable children: Secondary | ICD-10-CM | POA: Insufficient documentation

## 2013-05-09 NOTE — Evaluation (Signed)
Physical Therapy Discharge Summary  Patient Details  Name: ASMAA TIRPAK MRN: [id number] Date of Birth: 02-03-1961  Today's Date: 05/09/2013 Time: 7829-5621 PT Time Calculation (min): 39 min   Charges: ROMM x 1 (3086-5784) Self-care x 15' (1026-1041) Manual x 15' (1042-1057)             Visit#: 4 of 4  Re-eval: 05/23/13 Assessment Diagnosis: Cervical/shoulder pain Next MD Visit: Dr. Romeo Apple - unscheudled  Subjective Symptoms/Limitations Symptoms: Pt repots HEP compliance. Pain has decreased since beginning therapy. Pain Assessment Currently in Pain?: Yes Pain Score: 3  Pain Location: Neck Pain Orientation: Left  Cervical AROM Cervical - Right Side Bend: WNL (was decreased 50%) Cervical - Left Side Bend: WNL (was WNL) Cervical - Right Rotation: WNL (was WNL) Cervical - Left Rotation: WNL (was decreased 25%) Palpation Palpation: Decreased tightness and pain  Exercise/Treatments  Manual Therapy Myofascial Release: MFR to cervical region to reduce fascial restrictions L>R, suboccipitial release, manual cervical traction  Physical Therapy Assessment and Plan PT Assessment and Plan Clinical Impression Statement: Pt has progressed well with therapy. ROM has improved and pain has decreased. Pt is independent with HEP. Decreased tightness noted in left cervical musculature noted with palpation. Pt is comfortable with D/C to HEP.  PT Plan: Recommend D/C to HEP.    Goals Home Exercise Program Pt/caregiver will Perform Home Exercise Program: independently PT Goal: Perform Home Exercise Program - Progress: Met  Problem List Patient Active Problem List   Diagnosis Date Noted  . Cervical spine arthritis 04/10/2013  . Neck pain on left side 04/10/2013  . Acquired trigger finger 07/05/2012  . Tubular adenoma of colon 05/01/2012  . Diabetes mellitus 05/01/2012  . Trigger thumb of right hand 03/02/2012  . CTS (carpal tunnel syndrome) 08/11/2011  . S/P carpal tunnel release  07/19/2011  . Carpal tunnel syndrome of right wrist 03/24/2011    PT - End of Session Activity Tolerance: Patient tolerated treatment well General Behavior During Therapy: [facility] for tasks assessed/performed Cognition: WFL for tasks performed  Seth Bake, PTA; Annett Fabian, MPT, ATC  05/09/2013, 12:55 PM  Physician Documentation Your signature is required to indicate approval of the treatment plan as stated above.  Please sign and either send electronically or make a copy of this report for your files and return this physician signed original.   Please mark one 1.__approve of plan  2. ___approve of plan with the following conditions.   ______________________________                                                          _____________________ Physician Signature                                                                                                             Date

## 2013-12-20 ENCOUNTER — Other Ambulatory Visit (HOSPITAL_COMMUNITY): Payer: Self-pay | Admitting: Family Medicine

## 2013-12-20 DIAGNOSIS — Z139 Encounter for screening, unspecified: Secondary | ICD-10-CM

## 2013-12-24 ENCOUNTER — Ambulatory Visit (HOSPITAL_COMMUNITY)
Admission: RE | Admit: 2013-12-24 | Discharge: 2013-12-24 | Disposition: A | Discharge status: Home / Self Care | Payer: BC Managed Care – PPO | Source: Ambulatory Visit | Attending: Family Medicine | Admitting: Family Medicine

## 2013-12-24 DIAGNOSIS — Z139 Encounter for screening, unspecified: Secondary | ICD-10-CM

## 2013-12-24 DIAGNOSIS — Z1231 Encounter for screening mammogram for malignant neoplasm of breast: Secondary | ICD-10-CM | POA: Insufficient documentation

## 2014-01-11 ENCOUNTER — Other Ambulatory Visit: Payer: Self-pay | Admitting: Physician Assistant

## 2015-08-18 DIAGNOSIS — Z1389 Encounter for screening for other disorder: Secondary | ICD-10-CM | POA: Diagnosis not present

## 2015-08-18 DIAGNOSIS — I10 Essential (primary) hypertension: Secondary | ICD-10-CM | POA: Diagnosis not present

## 2015-08-18 DIAGNOSIS — Z23 Encounter for immunization: Secondary | ICD-10-CM | POA: Diagnosis not present

## 2015-08-18 DIAGNOSIS — E119 Type 2 diabetes mellitus without complications: Secondary | ICD-10-CM | POA: Diagnosis not present

## 2015-08-18 DIAGNOSIS — Z6841 Body Mass Index (BMI) 40.0 and over, adult: Secondary | ICD-10-CM | POA: Diagnosis not present

## 2015-08-18 DIAGNOSIS — E782 Mixed hyperlipidemia: Secondary | ICD-10-CM | POA: Diagnosis not present

## 2015-09-02 ENCOUNTER — Other Ambulatory Visit (HOSPITAL_COMMUNITY): Payer: Self-pay | Admitting: Family Medicine

## 2015-09-02 DIAGNOSIS — Z1231 Encounter for screening mammogram for malignant neoplasm of breast: Secondary | ICD-10-CM

## 2015-10-03 ENCOUNTER — Emergency Department (HOSPITAL_COMMUNITY)
Admission: EM | Admit: 2015-10-03 | Discharge: 2015-10-03 | Disposition: A | Discharge status: Home / Self Care | Payer: Medicare Other | Attending: Emergency Medicine | Admitting: Emergency Medicine

## 2015-10-03 ENCOUNTER — Emergency Department (HOSPITAL_COMMUNITY): Payer: Medicare Other

## 2015-10-03 ENCOUNTER — Encounter (HOSPITAL_COMMUNITY): Payer: Self-pay | Admitting: Emergency Medicine

## 2015-10-03 DIAGNOSIS — F329 Major depressive disorder, single episode, unspecified: Secondary | ICD-10-CM | POA: Diagnosis not present

## 2015-10-03 DIAGNOSIS — Z880 Allergy status to penicillin: Secondary | ICD-10-CM | POA: Diagnosis not present

## 2015-10-03 DIAGNOSIS — Z7982 Long term (current) use of aspirin: Secondary | ICD-10-CM | POA: Insufficient documentation

## 2015-10-03 DIAGNOSIS — Z86018 Personal history of other benign neoplasm: Secondary | ICD-10-CM | POA: Insufficient documentation

## 2015-10-03 DIAGNOSIS — E785 Hyperlipidemia, unspecified: Secondary | ICD-10-CM | POA: Diagnosis not present

## 2015-10-03 DIAGNOSIS — Z87891 Personal history of nicotine dependence: Secondary | ICD-10-CM | POA: Diagnosis not present

## 2015-10-03 DIAGNOSIS — M5431 Sciatica, right side: Secondary | ICD-10-CM

## 2015-10-03 DIAGNOSIS — J45909 Unspecified asthma, uncomplicated: Secondary | ICD-10-CM | POA: Diagnosis not present

## 2015-10-03 DIAGNOSIS — E119 Type 2 diabetes mellitus without complications: Secondary | ICD-10-CM | POA: Diagnosis not present

## 2015-10-03 DIAGNOSIS — M545 Low back pain: Secondary | ICD-10-CM | POA: Diagnosis present

## 2015-10-03 DIAGNOSIS — Z79899 Other long term (current) drug therapy: Secondary | ICD-10-CM | POA: Insufficient documentation

## 2015-10-03 DIAGNOSIS — G2581 Restless legs syndrome: Secondary | ICD-10-CM | POA: Diagnosis not present

## 2015-10-03 DIAGNOSIS — K219 Gastro-esophageal reflux disease without esophagitis: Secondary | ICD-10-CM | POA: Insufficient documentation

## 2015-10-03 DIAGNOSIS — Z85828 Personal history of other malignant neoplasm of skin: Secondary | ICD-10-CM | POA: Insufficient documentation

## 2015-10-03 DIAGNOSIS — F419 Anxiety disorder, unspecified: Secondary | ICD-10-CM | POA: Insufficient documentation

## 2015-10-03 DIAGNOSIS — I10 Essential (primary) hypertension: Secondary | ICD-10-CM | POA: Diagnosis not present

## 2015-10-03 DIAGNOSIS — M5432 Sciatica, left side: Secondary | ICD-10-CM | POA: Insufficient documentation

## 2015-10-03 HISTORY — DX: Dorsalgia, unspecified: M54.9

## 2015-10-03 LAB — URINALYSIS, ROUTINE W REFLEX MICROSCOPIC
Bilirubin Urine: NEGATIVE
Glucose, UA: NEGATIVE mg/dL
Hgb urine dipstick: NEGATIVE
Ketones, ur: NEGATIVE mg/dL
LEUKOCYTES UA: NEGATIVE
Nitrite: NEGATIVE
PROTEIN: NEGATIVE mg/dL
Specific Gravity, Urine: 1.015 (ref 1.005–1.030)
pH: 5.5 (ref 5.0–8.0)

## 2015-10-03 MED ORDER — OXYCODONE-ACETAMINOPHEN 5-325 MG PO TABS
2.0000 | ORAL_TABLET | Freq: Once | ORAL | Status: AC
  Administered 2015-10-03: 2 via ORAL
  Filled 2015-10-03: qty 2

## 2015-10-03 MED ORDER — OXYCODONE-ACETAMINOPHEN 5-325 MG PO TABS: 1.0000 | ORAL_TABLET | ORAL | Status: DC | PRN

## 2015-10-03 MED ORDER — DIAZEPAM 5 MG PO TABS
5.0000 mg | ORAL_TABLET | Freq: Once | ORAL | Status: AC
  Administered 2015-10-03: 5 mg via ORAL
  Filled 2015-10-03: qty 1

## 2015-10-03 MED ORDER — METHOCARBAMOL 500 MG PO TABS: 500.0000 mg | ORAL_TABLET | Freq: Three times a day (TID) | ORAL | Status: DC

## 2015-10-03 NOTE — ED Notes (Signed)
Pt states that she has chronic back pain and it is worse than normal.  States hurting in the middle of her low back.  Denies injury.

## 2015-10-03 NOTE — Discharge Instructions (Signed)
°  Sciatica °Sciatica is pain, weakness, numbness, or tingling along your sciatic nerve. The nerve starts in the lower back and runs down the back of each leg. Nerve damage or certain conditions pinch or put pressure on the sciatic nerve. This causes the pain, weakness, and other discomforts of sciatica. °HOME CARE  °· Only take medicine as told by your doctor. °· Apply ice to the affected area for 20 minutes. Do this 3-4 times a day for the first 48-72 hours. Then try heat in the same way. °· Exercise, stretch, or do your usual activities if these do not make your pain worse. °· Go to physical therapy as told by your doctor. °· Keep all doctor visits as told. °· Do not wear high heels or shoes that are not supportive. °· Get a firm mattress if your mattress is too soft to lessen pain and discomfort. °GET HELP RIGHT AWAY IF:  °· You cannot control when you poop (bowel movement) or pee (urinate). °· You have more weakness in your lower back, lower belly (pelvis), butt (buttocks), or legs. °· You have redness or puffiness (swelling) of your back. °· You have a burning feeling when you pee. °· You have pain that gets worse when you lie down. °· You have pain that wakes you from your sleep. °· Your pain is worse than past pain. °· Your pain lasts longer than 4 weeks. °· You are suddenly losing weight without reason. °MAKE SURE YOU:  °· Understand these instructions. °· Will watch this condition. °· Will get help right away if you are not doing well or get worse. °  °This information is not intended to replace advice given to you by your health care provider. Make sure you discuss any questions you have with your health care provider. °  °Document Released: 08/03/2008 Document Revised: 07/16/2015 Document Reviewed: 03/05/2012 °Elsevier Interactive Patient Education ©2016 [employer] ° ° °

## 2015-10-05 NOTE — ED Provider Notes (Signed)
CSN: [id number]     Arrival date & time 10/03/15  1028 History   First MD Initiated Contact with Patient 10/03/15 1224     Chief Complaint  Patient presents with  . Back Pain     (Consider location/radiation/quality/duration/timing/severity/associated sxs/prior Treatment) HPI  [name] is a 54 y.o. female who presents to the Emergency Department complaining of mid low back pain for several days.  She describes a constant, aching pain to her back and worsens with certain movements.  She states the pain radiates into both hips, buttocks and thighs.  She has not taken any medications for her symptoms.  She denies trauma, abd pain, numbness or weakness of the lower extremities and urine or bowel changes.     Past Medical History  Diagnosis Date  . Diabetes mellitus     Type II  . Asthma   . HTN (hypertension)   . Restless leg syndrome   . Anxiety   . Depression   . Reflux   . Cholesterol serum elevated   . GERD (gastroesophageal reflux disease)   . Cancer ([place])     skin cancer of nose-2002  . Hyperlipidemia   . Tubular adenoma of colon 06/17/09    Arnoldo Morale  . Sleep apnea   . Back pain    Past Surgical History  Procedure Laterality Date  . Vaginal hysterectomy    . Gallbladder surgery    . Ganglion cyst excision  hand x 2  . Skin cancer excision  on nose  . Cholecystectomy  2009    aph-Ziegler  . Abdominal hysterectomy  89    vag hysterectomy-destefano  . Appendectomy      with hysterectomy  . Skin graft  98    right calf-destefano  . Carpal tunnel release  07/14/2011    Procedure: CARPAL TUNNEL RELEASE;  Surgeon: Arther Abbott, MD;  Location: [facility];  Service: Orthopedics;  Laterality: Right;  . Colonoscopy  06/17/09    Jenkins-(adequate prep)sessile tubular adenoma, otherwise normal/tubular adenoma  . Trigger finger release Right 12/18/2012    Procedure: RELEASE TRIGGER FINGER/A-1 PULLEY; right thumb;  Surgeon: Carole Civil, MD;  Location: [facility];   Service: Orthopedics;  Laterality: Right;   Family History  Problem Relation Age of Onset  . Arthritis    . Diabetes    . Anesthesia problems Neg Hx   . Hypotension Neg Hx   . Malignant hyperthermia Neg Hx   . Pseudochol deficiency Neg Hx    Social History  Substance Use Topics  . Smoking status: Former Smoker -- 0.25 packs/day for 2 years    Types: Cigarettes    Quit date: 07/08/1974  . Smokeless tobacco: None     Comment: Quit for 20 plus years  . Alcohol Use: No   OB History    No data available     Review of Systems  Constitutional: Negative for fever.  Respiratory: Negative for shortness of breath.   Gastrointestinal: Negative for vomiting, abdominal pain and constipation.  Genitourinary: Negative for dysuria, hematuria, flank pain, decreased urine volume and difficulty urinating.  Musculoskeletal: Positive for back pain. Negative for joint swelling.  Skin: Negative for rash.  Neurological: Negative for weakness and numbness.  All other systems reviewed and are negative.     Allergies  Ace inhibitors; Penicillins; Clarithromycin; and Sulfa drugs cross reactors  Home Medications   Prior to Admission medications   Medication Sig Start Date End Date Taking? Authorizing Provider  ALPRAZolam Duanne Moron) 0.5  MG tablet Take 0.5 mg by mouth 4 (four) times daily as needed. Anxiety   Yes Historical Provider, MD  Ascorbic Acid (VITAMIN C PO) Take 1 tablet by mouth daily.   Yes Historical Provider, MD  aspirin 81 MG tablet Take 81 mg by mouth daily.    Yes Historical Provider, MD  Biotin 5000 MCG TABS Take 1 tablet by mouth daily.   Yes Historical Provider, MD  BuPROPion HCl (WELLBUTRIN XL PO) Take 300 mg by mouth every morning.   Yes Historical Provider, MD  clotrimazole-betamethasone (LOTRISONE) cream Apply 1 application topically 2 (two) times daily as needed. For affected areas of rash    Yes Historical Provider, MD  cyclobenzaprine (FLEXERIL) 10 MG tablet Take 10 mg by  mouth 3 (three) times daily as needed. Muscle Spasms   Yes Historical Provider, MD  DULoxetine (CYMBALTA) 60 MG capsule Take 60 mg by mouth daily.   Yes Historical Provider, MD  fexofenadine (ALLEGRA) 180 MG tablet Take 180 mg by mouth daily.     Yes Historical Provider, MD  fish oil-omega-3 fatty acids 1000 MG capsule Take 1 g by mouth daily.   Yes Historical Provider, MD  gabapentin (NEURONTIN) 100 MG capsule Take 300 mg by mouth 3 (three) times daily. Nerve Pain 03/16/12 10/03/15 Yes Carole Civil, MD  ibuprofen (ADVIL,MOTRIN) 200 MG tablet Take 400 mg by mouth every 6 (six) hours as needed. Pain   Yes Historical Provider, MD  losartan-hydrochlorothiazide (HYZAAR) 100-25 MG per tablet Take 1 tablet by mouth daily.   Yes Historical Provider, MD  metFORMIN (GLUCOPHAGE) 1000 MG tablet Take 1,000 mg by mouth 2 (two) times daily.   Yes Historical Provider, MD  omeprazole (PRILOSEC) 20 MG capsule Take 20 mg by mouth daily.     Yes Historical Provider, MD  pioglitazone (ACTOS) 30 MG tablet Take 30 mg by mouth daily.   Yes Historical Provider, MD  simethicone (MYLICON) 0000000 MG chewable tablet Chew 125 mg by mouth every 6 (six) hours as needed for flatulence.   Yes Historical Provider, MD  methocarbamol (ROBAXIN) 500 MG tablet Take 1 tablet (500 mg total) by mouth 3 (three) times daily. 10/03/15   Darci Lykins, PA-C  oxyCODONE-acetaminophen (PERCOCET/ROXICET) 5-325 MG tablet Take 1 tablet by mouth every 4 (four) hours as needed. 10/03/15   Ramil Edgington, PA-C   BP 154/67 mmHg  Pulse 88  Temp(Src) 98.2 F (36.8 C) (Oral)  Resp 18  Ht 5\' 4"  (1.626 m)  Wt 133.358 kg  BMI 50.44 kg/m2  SpO2 100% Physical Exam  Constitutional: She is oriented to person, place, and time. She appears well-developed and well-nourished. No distress.  HENT:  Head: Normocephalic and atraumatic.  Neck: Normal range of motion. Neck supple.  Cardiovascular: Normal rate, regular rhythm, normal heart sounds and intact  distal pulses.   No murmur heard. Pulmonary/Chest: Effort normal and breath sounds normal. No respiratory distress.  Abdominal: Soft. She exhibits no distension. There is no tenderness.  Musculoskeletal: She exhibits tenderness. She exhibits no edema.       Lumbar back: She exhibits tenderness and pain. She exhibits normal range of motion, no swelling, no deformity, no laceration and normal pulse.  ttp of the bilateral lumbar paraspinal muscles.  No spinal tenderness. Pain reproduced with SLR bilaterally. DP pulses are brisk and symmetrical.  Distal sensation intact.  Pt has 5/5 strength against resistance of bilateral lower extremities.     Neurological: She is alert and oriented to person, place, and time.  She has normal strength. No sensory deficit. She exhibits normal muscle tone. Coordination and gait normal.  Reflex Scores:      Patellar reflexes are 2+ on the right side and 2+ on the left side.      Achilles reflexes are 2+ on the right side and 2+ on the left side. Skin: Skin is warm and dry. No rash noted.  Nursing note and vitals reviewed.   ED Course  Procedures (including critical care time) Labs Review Labs Reviewed  URINALYSIS, ROUTINE W REFLEX MICROSCOPIC (NOT AT [facility])    Imaging Review Dg Lumbar Spine Complete  10/03/2015  CLINICAL DATA:  Low back pain for 5 days. EXAM: LUMBAR SPINE - COMPLETE 4+ VIEW COMPARISON:  Abdominal CT 09/28/2010 FINDINGS: Cholecystectomy clips in the right upper quadrant of the abdomen. There is stable grade 1 anterolisthesis at L4-L5 with mild disc space narrowing at L4-L5. Mild facet arthropathy in the lower lumbar spine. The vertebral body heights are maintained and no evidence for a fracture. Degenerative endplate changes at [id number]. No evidence for pars defect. 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  No acute abnormality. Electronically Signed   By: Markus Daft M.D.   On: 10/03/2015 13:05   I have personally reviewed and evaluated  these images and lab results as part of my medical decision-making.    MDM   Final diagnoses:  Bilateral sciatica   Pt with acute on chronic back pain.  Pain improved after medication.  Pt is ambulatory with steady gait.  No concerning sx's for emergent neurological or infectious process.  She agrees to symptomatic tx and close PMD f/u     Kem Parkinson, PA-C 10/05/15 2046  Milton Ferguson, MD 10/14/15 2316

## 2015-11-18 DIAGNOSIS — E119 Type 2 diabetes mellitus without complications: Secondary | ICD-10-CM | POA: Diagnosis not present

## 2015-11-18 DIAGNOSIS — I10 Essential (primary) hypertension: Secondary | ICD-10-CM | POA: Diagnosis not present

## 2015-11-18 DIAGNOSIS — Z1389 Encounter for screening for other disorder: Secondary | ICD-10-CM | POA: Diagnosis not present

## 2015-11-18 DIAGNOSIS — E782 Mixed hyperlipidemia: Secondary | ICD-10-CM | POA: Diagnosis not present

## 2015-11-18 DIAGNOSIS — E1140 Type 2 diabetes mellitus with diabetic neuropathy, unspecified: Secondary | ICD-10-CM | POA: Diagnosis not present

## 2015-12-03 DIAGNOSIS — E119 Type 2 diabetes mellitus without complications: Secondary | ICD-10-CM | POA: Diagnosis not present

## 2015-12-03 DIAGNOSIS — Z1389 Encounter for screening for other disorder: Secondary | ICD-10-CM | POA: Diagnosis not present

## 2015-12-25 ENCOUNTER — Ambulatory Visit (HOSPITAL_COMMUNITY): Payer: Self-pay

## 2015-12-26 ENCOUNTER — Other Ambulatory Visit (HOSPITAL_COMMUNITY): Payer: Self-pay | Admitting: Family Medicine

## 2015-12-26 ENCOUNTER — Ambulatory Visit (HOSPITAL_COMMUNITY): Payer: Self-pay

## 2015-12-26 ENCOUNTER — Ambulatory Visit (HOSPITAL_COMMUNITY)
Admission: RE | Admit: 2015-12-26 | Discharge: 2015-12-26 | Disposition: A | Discharge status: Home / Self Care | Payer: Medicare Other | Source: Ambulatory Visit | Attending: Family Medicine | Admitting: Family Medicine

## 2015-12-26 DIAGNOSIS — Z1231 Encounter for screening mammogram for malignant neoplasm of breast: Secondary | ICD-10-CM

## 2016-01-02 DIAGNOSIS — Z7689 Persons encountering health services in other specified circumstances: Secondary | ICD-10-CM | POA: Diagnosis not present

## 2016-02-24 DIAGNOSIS — Z1389 Encounter for screening for other disorder: Secondary | ICD-10-CM | POA: Diagnosis not present

## 2016-02-24 DIAGNOSIS — E119 Type 2 diabetes mellitus without complications: Secondary | ICD-10-CM | POA: Diagnosis not present

## 2016-02-24 DIAGNOSIS — E782 Mixed hyperlipidemia: Secondary | ICD-10-CM | POA: Diagnosis not present

## 2016-02-24 DIAGNOSIS — I10 Essential (primary) hypertension: Secondary | ICD-10-CM | POA: Diagnosis not present

## 2016-03-11 ENCOUNTER — Other Ambulatory Visit (HOSPITAL_COMMUNITY): Payer: Self-pay | Admitting: Family Medicine

## 2016-03-16 ENCOUNTER — Other Ambulatory Visit (HOSPITAL_COMMUNITY): Payer: Self-pay | Admitting: Family Medicine

## 2016-03-16 DIAGNOSIS — N644 Mastodynia: Secondary | ICD-10-CM

## 2016-03-23 ENCOUNTER — Ambulatory Visit (HOSPITAL_COMMUNITY)
Admission: RE | Admit: 2016-03-23 | Discharge: 2016-03-23 | Disposition: A | Discharge status: Home / Self Care | Payer: Medicare Other | Source: Ambulatory Visit | Attending: Family Medicine | Admitting: Family Medicine

## 2016-03-23 ENCOUNTER — Other Ambulatory Visit (HOSPITAL_COMMUNITY): Payer: Self-pay | Admitting: Family Medicine

## 2016-03-23 DIAGNOSIS — N63 Unspecified lump in breast: Secondary | ICD-10-CM | POA: Insufficient documentation

## 2016-03-23 DIAGNOSIS — Z6841 Body Mass Index (BMI) 40.0 and over, adult: Secondary | ICD-10-CM | POA: Diagnosis not present

## 2016-03-23 DIAGNOSIS — E119 Type 2 diabetes mellitus without complications: Secondary | ICD-10-CM | POA: Diagnosis not present

## 2016-03-23 DIAGNOSIS — N644 Mastodynia: Secondary | ICD-10-CM

## 2016-03-25 ENCOUNTER — Other Ambulatory Visit: Payer: Self-pay | Admitting: Obstetrics & Gynecology

## 2016-03-30 ENCOUNTER — Ambulatory Visit (HOSPITAL_COMMUNITY)
Admission: RE | Admit: 2016-03-30 | Discharge: 2016-03-30 | Disposition: A | Discharge status: Home / Self Care | Payer: Medicare Other | Source: Ambulatory Visit | Attending: Family Medicine | Admitting: Family Medicine

## 2016-03-30 ENCOUNTER — Other Ambulatory Visit (HOSPITAL_COMMUNITY): Payer: Medicare Other

## 2016-03-30 ENCOUNTER — Other Ambulatory Visit (HOSPITAL_COMMUNITY): Payer: Self-pay | Admitting: Family Medicine

## 2016-03-30 DIAGNOSIS — N644 Mastodynia: Secondary | ICD-10-CM | POA: Diagnosis not present

## 2016-03-30 DIAGNOSIS — N6092 Unspecified benign mammary dysplasia of left breast: Secondary | ICD-10-CM | POA: Insufficient documentation

## 2016-03-30 DIAGNOSIS — Z9889 Other specified postprocedural states: Secondary | ICD-10-CM | POA: Insufficient documentation

## 2016-03-30 DIAGNOSIS — N6320 Unspecified lump in the left breast, unspecified quadrant: Secondary | ICD-10-CM

## 2016-03-30 DIAGNOSIS — N6012 Diffuse cystic mastopathy of left breast: Secondary | ICD-10-CM | POA: Diagnosis not present

## 2016-03-30 DIAGNOSIS — N63 Unspecified lump in breast: Secondary | ICD-10-CM | POA: Insufficient documentation

## 2016-05-12 DIAGNOSIS — K5289 Other specified noninfective gastroenteritis and colitis: Secondary | ICD-10-CM | POA: Diagnosis not present

## 2016-05-12 DIAGNOSIS — A09 Infectious gastroenteritis and colitis, unspecified: Secondary | ICD-10-CM | POA: Diagnosis not present

## 2016-05-12 DIAGNOSIS — Z1389 Encounter for screening for other disorder: Secondary | ICD-10-CM | POA: Diagnosis not present

## 2016-05-25 DIAGNOSIS — R197 Diarrhea, unspecified: Secondary | ICD-10-CM | POA: Diagnosis not present

## 2016-05-25 DIAGNOSIS — Z1389 Encounter for screening for other disorder: Secondary | ICD-10-CM | POA: Diagnosis not present

## 2016-06-11 DIAGNOSIS — E119 Type 2 diabetes mellitus without complications: Secondary | ICD-10-CM | POA: Diagnosis not present

## 2016-06-11 DIAGNOSIS — I10 Essential (primary) hypertension: Secondary | ICD-10-CM | POA: Diagnosis not present

## 2016-07-23 DIAGNOSIS — D239 Other benign neoplasm of skin, unspecified: Secondary | ICD-10-CM | POA: Diagnosis not present

## 2016-07-23 DIAGNOSIS — L219 Seborrheic dermatitis, unspecified: Secondary | ICD-10-CM | POA: Diagnosis not present

## 2016-07-23 DIAGNOSIS — L304 Erythema intertrigo: Secondary | ICD-10-CM | POA: Diagnosis not present

## 2016-08-30 DIAGNOSIS — H524 Presbyopia: Secondary | ICD-10-CM | POA: Diagnosis not present

## 2016-08-30 DIAGNOSIS — H52223 Regular astigmatism, bilateral: Secondary | ICD-10-CM | POA: Diagnosis not present

## 2016-08-30 DIAGNOSIS — H5203 Hypermetropia, bilateral: Secondary | ICD-10-CM | POA: Diagnosis not present

## 2016-08-30 DIAGNOSIS — E119 Type 2 diabetes mellitus without complications: Secondary | ICD-10-CM | POA: Diagnosis not present

## 2016-09-27 DIAGNOSIS — E1165 Type 2 diabetes mellitus with hyperglycemia: Secondary | ICD-10-CM | POA: Diagnosis not present

## 2016-09-27 DIAGNOSIS — E782 Mixed hyperlipidemia: Secondary | ICD-10-CM | POA: Diagnosis not present

## 2016-09-27 DIAGNOSIS — I10 Essential (primary) hypertension: Secondary | ICD-10-CM | POA: Diagnosis not present

## 2016-09-27 DIAGNOSIS — Z1389 Encounter for screening for other disorder: Secondary | ICD-10-CM | POA: Diagnosis not present

## 2016-11-19 DIAGNOSIS — J069 Acute upper respiratory infection, unspecified: Secondary | ICD-10-CM | POA: Diagnosis not present

## 2016-12-13 ENCOUNTER — Ambulatory Visit (HOSPITAL_COMMUNITY)
Admission: RE | Admit: 2016-12-13 | Discharge: 2016-12-13 | Disposition: A | Discharge status: Home / Self Care | Payer: Medicare Other | Source: Ambulatory Visit | Attending: Family Medicine | Admitting: Family Medicine

## 2016-12-13 ENCOUNTER — Other Ambulatory Visit (HOSPITAL_COMMUNITY): Payer: Self-pay | Admitting: Family Medicine

## 2016-12-13 DIAGNOSIS — Z1389 Encounter for screening for other disorder: Secondary | ICD-10-CM | POA: Diagnosis not present

## 2016-12-13 DIAGNOSIS — R0781 Pleurodynia: Secondary | ICD-10-CM

## 2016-12-13 DIAGNOSIS — J984 Other disorders of lung: Secondary | ICD-10-CM | POA: Insufficient documentation

## 2016-12-13 DIAGNOSIS — R918 Other nonspecific abnormal finding of lung field: Secondary | ICD-10-CM | POA: Diagnosis present

## 2016-12-13 DIAGNOSIS — R05 Cough: Secondary | ICD-10-CM | POA: Diagnosis not present

## 2016-12-13 DIAGNOSIS — J40 Bronchitis, not specified as acute or chronic: Secondary | ICD-10-CM | POA: Diagnosis not present

## 2016-12-22 DIAGNOSIS — J209 Acute bronchitis, unspecified: Secondary | ICD-10-CM | POA: Diagnosis not present

## 2016-12-22 DIAGNOSIS — S2341XA Sprain of ribs, initial encounter: Secondary | ICD-10-CM | POA: Diagnosis not present

## 2017-01-10 DIAGNOSIS — E1140 Type 2 diabetes mellitus with diabetic neuropathy, unspecified: Secondary | ICD-10-CM | POA: Diagnosis not present

## 2017-01-10 DIAGNOSIS — E119 Type 2 diabetes mellitus without complications: Secondary | ICD-10-CM | POA: Diagnosis not present

## 2017-01-10 DIAGNOSIS — E1142 Type 2 diabetes mellitus with diabetic polyneuropathy: Secondary | ICD-10-CM | POA: Diagnosis not present

## 2017-01-10 DIAGNOSIS — E781 Pure hyperglyceridemia: Secondary | ICD-10-CM | POA: Diagnosis not present

## 2017-02-03 DIAGNOSIS — M25512 Pain in left shoulder: Secondary | ICD-10-CM | POA: Diagnosis not present

## 2017-02-03 DIAGNOSIS — M719 Bursopathy, unspecified: Secondary | ICD-10-CM | POA: Diagnosis not present

## 2017-03-29 DIAGNOSIS — G5702 Lesion of sciatic nerve, left lower limb: Secondary | ICD-10-CM | POA: Diagnosis not present

## 2017-03-29 DIAGNOSIS — M545 Low back pain: Secondary | ICD-10-CM | POA: Diagnosis not present

## 2017-04-11 ENCOUNTER — Emergency Department (HOSPITAL_COMMUNITY): Payer: Medicare Other

## 2017-04-11 ENCOUNTER — Emergency Department (HOSPITAL_COMMUNITY)
Admission: EM | Admit: 2017-04-11 | Discharge: 2017-04-11 | Disposition: A | Discharge status: Home / Self Care | Payer: Medicare Other | Attending: Emergency Medicine | Admitting: Emergency Medicine

## 2017-04-11 ENCOUNTER — Encounter (HOSPITAL_COMMUNITY): Payer: Self-pay | Admitting: *Deleted

## 2017-04-11 DIAGNOSIS — M5432 Sciatica, left side: Secondary | ICD-10-CM

## 2017-04-11 DIAGNOSIS — E119 Type 2 diabetes mellitus without complications: Secondary | ICD-10-CM | POA: Diagnosis not present

## 2017-04-11 DIAGNOSIS — Z79899 Other long term (current) drug therapy: Secondary | ICD-10-CM | POA: Diagnosis not present

## 2017-04-11 DIAGNOSIS — Z7984 Long term (current) use of oral hypoglycemic drugs: Secondary | ICD-10-CM | POA: Diagnosis not present

## 2017-04-11 DIAGNOSIS — I10 Essential (primary) hypertension: Secondary | ICD-10-CM | POA: Diagnosis not present

## 2017-04-11 DIAGNOSIS — Z85828 Personal history of other malignant neoplasm of skin: Secondary | ICD-10-CM | POA: Diagnosis not present

## 2017-04-11 DIAGNOSIS — M5442 Lumbago with sciatica, left side: Secondary | ICD-10-CM | POA: Insufficient documentation

## 2017-04-11 DIAGNOSIS — Z87891 Personal history of nicotine dependence: Secondary | ICD-10-CM | POA: Insufficient documentation

## 2017-04-11 DIAGNOSIS — M545 Low back pain: Secondary | ICD-10-CM | POA: Diagnosis present

## 2017-04-11 DIAGNOSIS — Z7982 Long term (current) use of aspirin: Secondary | ICD-10-CM | POA: Insufficient documentation

## 2017-04-11 DIAGNOSIS — J45909 Unspecified asthma, uncomplicated: Secondary | ICD-10-CM | POA: Insufficient documentation

## 2017-04-11 DIAGNOSIS — M47816 Spondylosis without myelopathy or radiculopathy, lumbar region: Secondary | ICD-10-CM | POA: Diagnosis not present

## 2017-04-11 MED ORDER — HYDROMORPHONE HCL 1 MG/ML IJ SOLN
1.0000 mg | Freq: Once | INTRAMUSCULAR | Status: AC
  Administered 2017-04-11: 1 mg via INTRAVENOUS
  Filled 2017-04-11: qty 1

## 2017-04-11 MED ORDER — ONDANSETRON HCL 4 MG/2ML IJ SOLN
4.0000 mg | Freq: Once | INTRAMUSCULAR | Status: AC
  Administered 2017-04-11: 4 mg via INTRAVENOUS
  Filled 2017-04-11: qty 2

## 2017-04-11 MED ORDER — HYDROCODONE-ACETAMINOPHEN 5-325 MG PO TABS: 1.0000 | ORAL_TABLET | Freq: Four times a day (QID) | ORAL | 0 refills | Status: DC | PRN

## 2017-04-11 NOTE — ED Provider Notes (Signed)
[facility] DEPT Provider Note   CSN: [id number] Arrival date & time: 04/11/17  0160     History   Chief Complaint Chief Complaint  Patient presents with  . Leg Pain    HPI [name] is a 56 y.o. female.  Patient complains of pain in her back running down her left leg. She was put on Mobic and the muscle after by her family doctor but this does not seem to help   The history is provided by the patient. No language interpreter was used.  Leg Pain   This is a recurrent problem. The current episode started more than 2 days ago. The problem occurs constantly. The problem has not changed since onset.Pain location: Lower back pain and pain down left leg. The quality of the pain is described as aching. The pain is at a severity of 7/10. The pain is moderate. Pertinent negatives include no stiffness. Exacerbated by: Nothing. She has tried arthritis medications for the symptoms. The treatment provided no relief. There has been no history of extremity trauma.    Past Medical History:  Diagnosis Date  . Anxiety   . Asthma   . Back pain   . Cancer ([place])    skin cancer of nose-2002  . Cholesterol serum elevated   . Depression   . Diabetes mellitus    Type II  . GERD (gastroesophageal reflux disease)   . HTN (hypertension)   . Hyperlipidemia   . Reflux   . Restless leg syndrome   . Sleep apnea   . Tubular adenoma of colon 06/17/09   Arnoldo Morale    Patient Active Problem List   Diagnosis Date Noted  . Cervical spine arthritis ([place]) 04/10/2013  . Neck pain on left side 04/10/2013  . Acquired trigger finger 07/05/2012  . Tubular adenoma of colon 05/01/2012  . Diabetes mellitus ([place]) 05/01/2012  . Trigger thumb of right hand 03/02/2012  . CTS (carpal tunnel syndrome) 08/11/2011  . S/P carpal tunnel release 07/19/2011  . Carpal tunnel syndrome of right wrist 03/24/2011    Past Surgical History:  Procedure Laterality Date  . ABDOMINAL HYSTERECTOMY  89   vag  hysterectomy-destefano  . APPENDECTOMY     with hysterectomy  . CARPAL TUNNEL RELEASE  07/14/2011   Procedure: CARPAL TUNNEL RELEASE;  Surgeon: Arther Abbott, MD;  Location: [facility];  Service: Orthopedics;  Laterality: Right;  . CHOLECYSTECTOMY  2009   aph-Ziegler  . COLONOSCOPY  06/17/09   Jenkins-(adequate prep)sessile tubular adenoma, otherwise normal/tubular adenoma  . GALLBLADDER SURGERY    . GANGLION CYST EXCISION  hand x 2  . SKIN CANCER EXCISION  on nose  . SKIN GRAFT  98   right calf-destefano  . TRIGGER FINGER RELEASE Right 12/18/2012   Procedure: RELEASE TRIGGER FINGER/A-1 PULLEY; right thumb;  Surgeon: Carole Civil, MD;  Location: [facility];  Service: Orthopedics;  Laterality: Right;  Marland Kitchen VAGINAL HYSTERECTOMY      OB History    No data available       Home Medications    Prior to Admission medications   Medication Sig Start Date End Date Taking? Authorizing Provider  ALPRAZolam Duanne Moron) 0.5 MG tablet Take 0.5 mg by mouth 4 (four) times daily as needed. Anxiety   Yes [provider]  aspirin 81 MG tablet Take 81 mg by mouth daily.    Yes [provider]  Biotin 5000 MCG TABS Take 1 tablet by mouth daily.   Yes [provider]  buPROPion (WELLBUTRIN XL) 150 MG 24 hr tablet Take 1 tablet by mouth 3 (three) times daily. 04/08/17  Yes [provider]  clotrimazole-betamethasone (LOTRISONE) cream Apply 1 application topically 2 (two) times daily as needed. For affected areas of rash    Yes [provider]  cyclobenzaprine (FLEXERIL) 10 MG tablet Take 10 mg by mouth 3 (three) times daily as needed. Muscle Spasms   Yes [provider]  DULoxetine (CYMBALTA) 60 MG capsule Take 60 mg by mouth daily.   Yes [provider]  fexofenadine (ALLEGRA) 180 MG tablet Take 180 mg by mouth daily.     Yes [provider]  gabapentin (NEURONTIN) 300 MG capsule Take 1 capsule by mouth 3 (three) times daily. 04/08/17  Yes  [provider]  losartan-hydrochlorothiazide (HYZAAR) 100-25 MG per tablet Take 1 tablet by mouth daily.   Yes [provider]  meloxicam (MOBIC) 15 MG tablet Take 1 tablet by mouth daily. 03/29/17  Yes [provider]  metFORMIN (GLUCOPHAGE) 1000 MG tablet Take 1,000 mg by mouth 2 (two) times daily.   Yes [provider]  omeprazole (PRILOSEC) 20 MG capsule Take 20 mg by mouth daily.     Yes [provider]  pioglitazone (ACTOS) 30 MG tablet Take 30 mg by mouth daily.   Yes [provider]  PRESCRIPTION MEDICATION Apply 1 application topically 4 (four) times daily as needed. Compound cream: Diclofenac sodium 3%, Baclofen 2%, Gabapentin 5%, Lidocaine 5%, Menthol 1%   Yes [provider]  triamcinolone cream (KENALOG) 0.1 % Apply 1 application topically 2 (two) times daily.  03/29/17  Yes [provider]  HYDROcodone-acetaminophen (NORCO/VICODIN) 5-325 MG tablet Take 1-2 tablets by mouth every 6 (six) hours as needed for moderate pain. 04/11/17   Milton Ferguson, MD    Family History Family History  Problem Relation Age of Onset  . Arthritis Unknown   . Diabetes Unknown   . Anesthesia problems Neg Hx   . Hypotension Neg Hx   . Malignant hyperthermia Neg Hx   . Pseudochol deficiency Neg Hx     Social History Social History  Substance Use Topics  . Smoking status: Former Smoker    Packs/day: 0.25    Years: 2.00    Types: Cigarettes    Quit date: 07/08/1974  . Smokeless tobacco: Never Used     Comment: Quit for 20 plus years  . Alcohol use No     Allergies   Ace inhibitors; Neosporin [neomycin-bacitracin zn-polymyx]; Penicillins; Clarithromycin; and Sulfa drugs cross reactors   Review of Systems Review of Systems  Constitutional: Negative for appetite change and fatigue.  HENT: Negative for congestion, ear discharge and sinus pressure.   Eyes: Negative for discharge.  Respiratory: Negative for cough.     Cardiovascular: Negative for chest pain.  Gastrointestinal: Negative for abdominal pain and diarrhea.  Genitourinary: Negative for frequency and hematuria.  Musculoskeletal: Positive for back pain. Negative for stiffness.       Pain and numbness in left leg  Skin: Negative for rash.  Neurological: Negative for seizures and headaches.  Psychiatric/Behavioral: Negative for hallucinations.     Physical Exam Updated Vital Signs BP (!) 142/77   Pulse (!) 104   Temp 98.1 F (36.7 C) (Oral)   Resp 20   Ht 5\' 7"  (1.702 m)   Wt 136.1 kg (300 lb)   SpO2 99%   BMI 46.99 kg/m   Physical Exam  Constitutional: She is oriented to person, place,  and time. She appears well-developed.  HENT:  Head: Normocephalic.  Eyes: Conjunctivae and EOM are normal. No scleral icterus.  Neck: Neck supple. No thyromegaly present.  Cardiovascular: Normal rate and regular rhythm.  Exam reveals no gallop and no friction rub.   No murmur heard. Pulmonary/Chest: No stridor. She has no wheezes. She has no rales. She exhibits no tenderness.  Abdominal: She exhibits no distension. There is no tenderness. There is no rebound.  Musculoskeletal: Normal range of motion. She exhibits no edema.  Patient has pain in her lower back with tenderness. Positive straight leg raise on the left  Lymphadenopathy:    She has no cervical adenopathy.  Neurological: She is oriented to person, place, and time. She exhibits normal muscle tone. Coordination normal.  Skin: No rash noted. No erythema.  Psychiatric: She has a normal mood and affect. Her behavior is normal.     ED Treatments / Results  Labs (all labs ordered are listed, but only abnormal results are displayed) Labs Reviewed - No data to display  EKG  EKG Interpretation None       Radiology Dg Lumbar Spine Complete  Result Date: 04/11/2017 CLINICAL DATA:  Left-sided sciatic pain, 3 weeks duration. EXAM: LUMBAR SPINE - COMPLETE 4+ VIEW COMPARISON:  10/03/2015  FINDINGS: Minimal chronic thoracolumbar curvature. 4 mm anterolisthesis L4-5. Mild disc space narrowing throughout the lumbar region, most pronounced at L5-S1 were there is vacuum phenomenon. Lower lumbar facet osteoarthritis. IMPRESSION: Lumbar degenerative disc disease and degenerative facet disease. Anterolisthesis of 4 mm at L4-5. Similar appearance to the previous exam. Electronically Signed   By: Nelson Chimes M.D.   On: 04/11/2017 12:44    Procedures Procedures (including critical care time)  Medications Ordered in ED Medications  HYDROmorphone (DILAUDID) injection 1 mg (1 mg Intravenous Given 04/11/17 1155)  ondansetron (ZOFRAN) injection 4 mg (4 mg Intravenous Given 04/11/17 1155)     Initial Impression / Assessment and Plan / ED Course  I have reviewed the triage vital signs and the nursing notes.  Pertinent labs & imaging results that were available during my care of the patient were reviewed by me and considered in my medical decision making (see chart for details).   patient improved with pain medicine. Patient will be discharged with Vicodin and continue taking her Mobitz possibly continue her muscle relaxer. She will follow-up with her family doctor next week    Final Clinical Impressions(s) / ED Diagnoses   Final diagnoses:  Sciatica of left side    New Prescriptions New Prescriptions   HYDROCODONE-ACETAMINOPHEN (NORCO/VICODIN) 5-325 MG TABLET    Take 1-2 tablets by mouth every 6 (six) hours as needed for moderate pain.     Milton Ferguson, MD 04/11/17 [phone]

## 2017-04-11 NOTE — ED Triage Notes (Addendum)
C/o lower back pain radiating down to buttock into left leg x 2weeks pt has been using mobic and flexeril w/o relief.  Denies injury

## 2017-04-11 NOTE — Discharge Instructions (Signed)
Follow  up with your md next week.  Continue taking your mobic

## 2017-05-20 DIAGNOSIS — E782 Mixed hyperlipidemia: Secondary | ICD-10-CM | POA: Diagnosis not present

## 2017-05-20 DIAGNOSIS — E1165 Type 2 diabetes mellitus with hyperglycemia: Secondary | ICD-10-CM | POA: Diagnosis not present

## 2017-05-20 DIAGNOSIS — I10 Essential (primary) hypertension: Secondary | ICD-10-CM | POA: Diagnosis not present

## 2017-05-20 DIAGNOSIS — Z1389 Encounter for screening for other disorder: Secondary | ICD-10-CM | POA: Diagnosis not present

## 2017-06-17 ENCOUNTER — Emergency Department (HOSPITAL_COMMUNITY)
Admission: EM | Admit: 2017-06-17 | Discharge: 2017-06-17 | Disposition: A | Discharge status: Home / Self Care | Payer: Medicare Other | Attending: Emergency Medicine | Admitting: Emergency Medicine

## 2017-06-17 ENCOUNTER — Encounter (HOSPITAL_COMMUNITY): Payer: Self-pay

## 2017-06-17 DIAGNOSIS — Y939 Activity, unspecified: Secondary | ICD-10-CM | POA: Insufficient documentation

## 2017-06-17 DIAGNOSIS — W0110XA Fall on same level from slipping, tripping and stumbling with subsequent striking against unspecified object, initial encounter: Secondary | ICD-10-CM | POA: Diagnosis not present

## 2017-06-17 DIAGNOSIS — Z87891 Personal history of nicotine dependence: Secondary | ICD-10-CM | POA: Diagnosis not present

## 2017-06-17 DIAGNOSIS — E119 Type 2 diabetes mellitus without complications: Secondary | ICD-10-CM | POA: Diagnosis not present

## 2017-06-17 DIAGNOSIS — J45909 Unspecified asthma, uncomplicated: Secondary | ICD-10-CM | POA: Diagnosis not present

## 2017-06-17 DIAGNOSIS — S61402A Unspecified open wound of left hand, initial encounter: Secondary | ICD-10-CM | POA: Diagnosis not present

## 2017-06-17 DIAGNOSIS — Z7982 Long term (current) use of aspirin: Secondary | ICD-10-CM | POA: Diagnosis not present

## 2017-06-17 DIAGNOSIS — R000 Tachycardia, unspecified: Secondary | ICD-10-CM | POA: Diagnosis not present

## 2017-06-17 DIAGNOSIS — Y929 Unspecified place or not applicable: Secondary | ICD-10-CM | POA: Diagnosis not present

## 2017-06-17 DIAGNOSIS — S81812A Laceration without foreign body, left lower leg, initial encounter: Secondary | ICD-10-CM | POA: Diagnosis not present

## 2017-06-17 DIAGNOSIS — Y999 Unspecified external cause status: Secondary | ICD-10-CM | POA: Insufficient documentation

## 2017-06-17 DIAGNOSIS — Z79899 Other long term (current) drug therapy: Secondary | ICD-10-CM | POA: Insufficient documentation

## 2017-06-17 DIAGNOSIS — Z791 Long term (current) use of non-steroidal anti-inflammatories (NSAID): Secondary | ICD-10-CM | POA: Insufficient documentation

## 2017-06-17 DIAGNOSIS — S8992XA Unspecified injury of left lower leg, initial encounter: Secondary | ICD-10-CM | POA: Diagnosis present

## 2017-06-17 DIAGNOSIS — Z7984 Long term (current) use of oral hypoglycemic drugs: Secondary | ICD-10-CM | POA: Insufficient documentation

## 2017-06-17 DIAGNOSIS — I10 Essential (primary) hypertension: Secondary | ICD-10-CM | POA: Diagnosis not present

## 2017-06-17 DIAGNOSIS — Z85828 Personal history of other malignant neoplasm of skin: Secondary | ICD-10-CM | POA: Diagnosis not present

## 2017-06-17 MED ORDER — POVIDONE-IODINE 10 % EX SOLN
CUTANEOUS | Status: AC
  Filled 2017-06-17: qty 15

## 2017-06-17 MED ORDER — OXYCODONE-ACETAMINOPHEN 5-325 MG PO TABS
1.0000 | ORAL_TABLET | Freq: Once | ORAL | Status: AC
  Administered 2017-06-17: 1 via ORAL
  Filled 2017-06-17: qty 1

## 2017-06-17 MED ORDER — LIDOCAINE HCL (PF) 1 % IJ SOLN
30.0000 mL | Freq: Once | INTRAMUSCULAR | Status: AC
  Administered 2017-06-17: 20 mL
  Filled 2017-06-17: qty 30

## 2017-06-17 MED ORDER — HYDROCODONE-ACETAMINOPHEN 5-325 MG PO TABS: 1.0000 | ORAL_TABLET | ORAL | 0 refills | Status: AC | PRN

## 2017-06-17 MED ORDER — POVIDONE-IODINE 10 % EX SOLN
CUTANEOUS | Status: AC
  Filled 2017-06-17: qty 30

## 2017-06-17 MED ORDER — LIDOCAINE HCL (PF) 1 % IJ SOLN
INTRAMUSCULAR | Status: AC
  Filled 2017-06-17: qty 30

## 2017-06-17 NOTE — Discharge Instructions (Signed)
Keep the wound clean with soap and water at least once a day.  After cleansing, rinse well dry then apply a light bandage, in case it bleeds.  Use the Ace wrap as needed to help you be comfortable and able to walk.  Try to elevate the left leg, but heart is much as possible over the next 3 days.  See your PCP or return here in 12-14 days for suture removal, and as needed, for problems.

## 2017-06-17 NOTE — ED Provider Notes (Signed)
[facility] DEPT Provider Note   CSN: [id number] Arrival date & time: 06/17/17  1527     History   Chief Complaint Chief Complaint  Patient presents with  . Extremity Laceration    HPI [name] is a 56 y.o. female.  She was trying to walk over a short gait, when she got tangled in it, cutting her left leg, and falling backwards.  She was able to maneuver herself back into her house after this happened.  She bumped her head, but denies headache, neck pain or back pain.  She cut her left knee, and is able to walk.  She does not know her last tetanus, injection date.  She denies paresthesia, weakness, nausea, vomiting, chest pain or shortness of breath.  There are no other no modifying factors.   HPI  Past Medical History:  Diagnosis Date  . Anxiety   . Asthma   . Back pain   . Cancer ([place])    skin cancer of nose-2002  . Cholesterol serum elevated   . Depression   . Diabetes mellitus    Type II  . GERD (gastroesophageal reflux disease)   . HTN (hypertension)   . Hyperlipidemia   . Reflux   . Restless leg syndrome   . Sleep apnea   . Tubular adenoma of colon 06/17/09   Arnoldo Morale    Patient Active Problem List   Diagnosis Date Noted  . Cervical spine arthritis ([place]) 04/10/2013  . Neck pain on left side 04/10/2013  . Acquired trigger finger 07/05/2012  . Tubular adenoma of colon 05/01/2012  . Diabetes mellitus ([place]) 05/01/2012  . Trigger thumb of right hand 03/02/2012  . CTS (carpal tunnel syndrome) 08/11/2011  . S/P carpal tunnel release 07/19/2011  . Carpal tunnel syndrome of right wrist 03/24/2011    Past Surgical History:  Procedure Laterality Date  . ABDOMINAL HYSTERECTOMY  89   vag hysterectomy-destefano  . APPENDECTOMY     with hysterectomy  . CARPAL TUNNEL RELEASE  07/14/2011   Procedure: CARPAL TUNNEL RELEASE;  Surgeon: Arther Abbott, MD;  Location: [facility];  Service: Orthopedics;  Laterality: Right;  . CHOLECYSTECTOMY  2009   aph-Ziegler   . COLONOSCOPY  06/17/09   Jenkins-(adequate prep)sessile tubular adenoma, otherwise normal/tubular adenoma  . GALLBLADDER SURGERY    . GANGLION CYST EXCISION  hand x 2  . SKIN CANCER EXCISION  on nose  . SKIN GRAFT  98   right calf-destefano  . TRIGGER FINGER RELEASE Right 12/18/2012   Procedure: RELEASE TRIGGER FINGER/A-1 PULLEY; right thumb;  Surgeon: Carole Civil, MD;  Location: [facility];  Service: Orthopedics;  Laterality: Right;  Marland Kitchen VAGINAL HYSTERECTOMY      OB History    No data available       Home Medications    Prior to Admission medications   Medication Sig Start Date End Date Taking? Authorizing Provider  ALPRAZolam Duanne Moron) 0.5 MG tablet Take 0.5 mg by mouth 4 (four) times daily as needed. Anxiety   Yes [provider]  aspirin 81 MG tablet Take 81 mg by mouth daily.    Yes [provider]  Biotin 5000 MCG TABS Take 1 tablet by mouth daily.   Yes [provider]  buPROPion (WELLBUTRIN XL) 150 MG 24 hr tablet Take 450 tablets by mouth daily.  04/08/17  Yes [provider]  cyclobenzaprine (FLEXERIL) 10 MG tablet Take 10 mg by mouth 3 (three) times daily as needed. Muscle Spasms   Yes  [provider]  DULoxetine (CYMBALTA) 60 MG capsule Take 60 mg by mouth daily.   Yes [provider]  fexofenadine (ALLEGRA) 180 MG tablet Take 180 mg by mouth daily.     Yes [provider]  fluorometholone (FML) 0.1 % ophthalmic suspension Place 1 drop into both eyes 3 (three) times daily. 06/15/17  Yes [provider]  gabapentin (NEURONTIN) 300 MG capsule Take 1 capsule by mouth 3 (three) times daily as needed.  04/08/17  Yes [provider]  losartan-hydrochlorothiazide (HYZAAR) 100-25 MG per tablet Take 1 tablet by mouth daily.   Yes [provider]  meloxicam (MOBIC) 15 MG tablet Take 1 tablet by mouth daily. 03/29/17  Yes [provider]  metFORMIN (GLUCOPHAGE) 1000 MG tablet Take 1,000 mg by  mouth 2 (two) times daily.   Yes [provider]  omeprazole (PRILOSEC) 20 MG capsule Take 20 mg by mouth daily.     Yes [provider]  ondansetron (ZOFRAN-ODT) 4 MG disintegrating tablet Take 1 tablet by mouth 3 (three) times daily as needed. 06/06/17  Yes [provider]  pioglitazone (ACTOS) 30 MG tablet Take 30 mg by mouth daily.   Yes [provider]  PRESCRIPTION MEDICATION Apply 1 application topically 4 (four) times daily as needed. Compound cream: Diclofenac sodium 3%, Baclofen 2%, Gabapentin 5%, Lidocaine 5%, Menthol 1%   Yes [provider]  HYDROcodone-acetaminophen (NORCO) 5-325 MG tablet Take 1 tablet by mouth every 4 (four) hours as needed for moderate pain. 06/17/17   Daleen Bo, MD    Family History Family History  Problem Relation Age of Onset  . Arthritis Unknown   . Diabetes Unknown   . Anesthesia problems Neg Hx   . Hypotension Neg Hx   . Malignant hyperthermia Neg Hx   . Pseudochol deficiency Neg Hx     Social History Social History  Substance Use Topics  . Smoking status: Former Smoker    Packs/day: 0.25    Years: 2.00    Types: Cigarettes    Quit date: 07/08/1974  . Smokeless tobacco: Never Used     Comment: Quit for 20 plus years  . Alcohol use No     Allergies   Ace inhibitors; Neosporin [neomycin-bacitracin zn-polymyx]; Penicillins; Clarithromycin; and Sulfa drugs cross reactors   Review of Systems Review of Systems  All other systems reviewed and are negative.    Physical Exam Updated Vital Signs BP (!) 179/58 (BP Location: Right Arm)   Pulse 81   Temp 98.7 F (37.1 C) (Oral)   Resp 19   SpO2 97%   Physical Exam  Constitutional: She is oriented to person, place, and time. She appears well-developed and well-nourished.  HENT:  Head: Normocephalic and atraumatic.  Small contusion right occiput, without significant swelling, crepitation or deformity.  Eyes: Pupils are equal, round, and  reactive to light. Conjunctivae and EOM are normal.  Neck: Normal range of motion and phonation normal. Neck supple.  Cardiovascular: Normal rate and regular rhythm.   Pulmonary/Chest: Effort normal and breath sounds normal. She exhibits no tenderness.  Abdominal: Soft. She exhibits no distension. There is no tenderness. There is no guarding.  Musculoskeletal: Normal range of motion.  Large laceration chest inferior to left knee, anterolateral orientation.  Intact extension of the left knee, and neurovascular intact distally in the left foot.  Neurological: She is alert and oriented to person, place, and time. She exhibits normal muscle tone.  Skin: Skin is warm and dry.  Psychiatric: She has a normal mood and affect. Her behavior is normal. Judgment and thought content normal.  Nursing note and vitals reviewed.    ED Treatments / Results  Labs (all labs ordered are listed, but only abnormal results are displayed) Labs Reviewed - No data to display  EKG  EKG Interpretation None       Radiology No results found.  Procedures .Marland KitchenLaceration Repair Date/Time: 06/17/2017 6:47 PM Performed by: Daleen Bo Authorized by: Daleen Bo   Consent:    Consent obtained:  Verbal   Risks discussed:  Infection, pain, poor cosmetic result and need for additional repair   Alternatives discussed:  No treatment Anesthesia (see MAR for exact dosages):    Anesthesia method:  Local infiltration   Local anesthetic:  Lidocaine 1% w/o epi Laceration details:    Location:  Leg   Leg location:  L lower leg   Length (cm):  15.5   Depth (mm):  25 Repair type:    Repair type:  Intermediate Pre-procedure details:    Preparation:  Patient was prepped and draped in usual sterile fashion Exploration:    Hemostasis achieved with:  Direct pressure   Wound exploration: wound explored through full range of motion and entire depth of wound probed and visualized     Wound extent: areolar tissue  violated     Wound extent: no fascia violation noted, no foreign bodies/material noted, no muscle damage noted, no nerve damage noted, no tendon damage noted, no underlying fracture noted and no vascular damage noted   Treatment:    Area cleansed with:  Betadine   Amount of cleaning:  Extensive   Irrigation solution:  Sterile water   Irrigation volume:  40 cc   Irrigation method:  Syringe   Visualized foreign bodies/material removed: no   Subcutaneous repair:    Suture size:  3-0   Suture material:  Vicryl   Suture technique:  Vertical mattress   Number of sutures:  10 Skin repair:    Repair method:  Sutures   Suture size:  3-0   Suture material:  Prolene   Suture technique:  Simple interrupted   Number of sutures:  14 Post-procedure details:    Dressing:  Antibiotic ointment, non-adherent dressing and bulky dressing   Patient tolerance of procedure:  Tolerated well, no immediate complications    (including critical care time)  Medications Ordered in ED Medications  povidone-iodine (BETADINE) 10 % external solution (not administered)  povidone-iodine (BETADINE) 10 % external solution (not administered)  lidocaine (PF) (XYLOCAINE) 1 % injection 30 mL (20 mLs Infiltration Given 06/17/17 1807)  oxyCODONE-acetaminophen (PERCOCET/ROXICET) 5-325 MG per tablet 1 tablet (1 tablet Oral Given 06/17/17 1806)     Initial Impression / Assessment and Plan / ED Course  I have reviewed the triage vital signs and the nursing notes.  Pertinent labs & imaging results that were available during my care of the patient were reviewed by me and considered in my medical decision making (see chart for details).      Patient Vitals for the past 24 hrs:  BP Temp Temp src Pulse Resp SpO2  06/17/17 1905 (!) 179/58 - - 81 19 97 %  06/17/17 1526 - 98.7 F (37.1 C) Oral - - -  06/17/17 1524 (!) 157/86 - - (!) 110 20 97 %    7:51 PM Reevaluation with update and discussion. After initial assessment  and treatment, an updated evaluation reveals she is comfortable, able to ambulate per usual,  and has no further complaints.  Findings discussed with the patient, and all questions were answered. Olanna Percifield L      Final Clinical Impressions(s) / ED Diagnoses   Final diagnoses:  Laceration of left lower extremity, initial encounter   Accidental fall, with leg laceration, and head contusion.  Doubt serious injury.  Doubt fracture.  Doubt ligamentous or nerve injury of the left leg wound.  Nursing Notes Reviewed/ Care Coordinated Applicable Imaging Reviewed Interpretation of Laboratory Data incorporated into ED treatment  The patient appears reasonably screened and/or stabilized for discharge and I doubt any other medical condition or other [facility] requiring further screening, evaluation, or treatment in the ED at this time prior to discharge.  Plan: Home Medications-continue usual medications; Home Treatments-wound care at home; return here if the recommended treatment, does not improve the symptoms; Recommended follow up-suture removal 12-14 days, follow-up sooner if needed.   New Prescriptions New Prescriptions   HYDROCODONE-ACETAMINOPHEN (NORCO) 5-325 MG TABLET    Take 1 tablet by mouth every 4 (four) hours as needed for moderate pain.     Daleen Bo, MD 06/17/17 [phone]

## 2017-06-17 NOTE — ED Triage Notes (Signed)
Pt reports that she was stepping over a lattice dog gait and fell. Pt has a large laceration to left knee. Bleeding controlled. No loss of consciousness. States she hit the back of her head when she fell

## 2017-07-01 DIAGNOSIS — Z1389 Encounter for screening for other disorder: Secondary | ICD-10-CM | POA: Diagnosis not present

## 2017-07-01 DIAGNOSIS — Z4802 Encounter for removal of sutures: Secondary | ICD-10-CM | POA: Diagnosis not present

## 2017-07-01 DIAGNOSIS — E119 Type 2 diabetes mellitus without complications: Secondary | ICD-10-CM | POA: Diagnosis not present

## 2017-07-01 DIAGNOSIS — Z23 Encounter for immunization: Secondary | ICD-10-CM | POA: Diagnosis not present

## 2017-08-23 DIAGNOSIS — J45909 Unspecified asthma, uncomplicated: Secondary | ICD-10-CM | POA: Diagnosis not present

## 2017-08-23 DIAGNOSIS — E119 Type 2 diabetes mellitus without complications: Secondary | ICD-10-CM | POA: Diagnosis not present

## 2017-08-23 DIAGNOSIS — E782 Mixed hyperlipidemia: Secondary | ICD-10-CM | POA: Diagnosis not present

## 2017-08-23 DIAGNOSIS — I10 Essential (primary) hypertension: Secondary | ICD-10-CM | POA: Diagnosis not present

## 2017-08-23 DIAGNOSIS — Z23 Encounter for immunization: Secondary | ICD-10-CM | POA: Diagnosis not present

## 2017-08-31 DIAGNOSIS — H52223 Regular astigmatism, bilateral: Secondary | ICD-10-CM | POA: Diagnosis not present

## 2017-08-31 DIAGNOSIS — H5203 Hypermetropia, bilateral: Secondary | ICD-10-CM | POA: Diagnosis not present

## 2017-08-31 DIAGNOSIS — H524 Presbyopia: Secondary | ICD-10-CM | POA: Diagnosis not present

## 2017-10-27 DIAGNOSIS — J069 Acute upper respiratory infection, unspecified: Secondary | ICD-10-CM | POA: Diagnosis not present

## 2017-12-14 DIAGNOSIS — E1142 Type 2 diabetes mellitus with diabetic polyneuropathy: Secondary | ICD-10-CM | POA: Diagnosis not present

## 2017-12-14 DIAGNOSIS — E781 Pure hyperglyceridemia: Secondary | ICD-10-CM | POA: Diagnosis not present

## 2017-12-14 DIAGNOSIS — E782 Mixed hyperlipidemia: Secondary | ICD-10-CM | POA: Diagnosis not present

## 2017-12-14 DIAGNOSIS — E1140 Type 2 diabetes mellitus with diabetic neuropathy, unspecified: Secondary | ICD-10-CM | POA: Diagnosis not present

## 2017-12-14 DIAGNOSIS — I10 Essential (primary) hypertension: Secondary | ICD-10-CM | POA: Diagnosis not present

## 2017-12-14 DIAGNOSIS — Z1389 Encounter for screening for other disorder: Secondary | ICD-10-CM | POA: Diagnosis not present

## 2017-12-15 DIAGNOSIS — E119 Type 2 diabetes mellitus without complications: Secondary | ICD-10-CM | POA: Diagnosis not present

## 2018-04-17 DIAGNOSIS — Z1389 Encounter for screening for other disorder: Secondary | ICD-10-CM | POA: Diagnosis not present

## 2018-04-17 DIAGNOSIS — M1991 Primary osteoarthritis, unspecified site: Secondary | ICD-10-CM | POA: Diagnosis not present

## 2018-04-17 DIAGNOSIS — I10 Essential (primary) hypertension: Secondary | ICD-10-CM | POA: Diagnosis not present

## 2018-04-17 DIAGNOSIS — E1142 Type 2 diabetes mellitus with diabetic polyneuropathy: Secondary | ICD-10-CM | POA: Diagnosis not present

## 2018-04-17 DIAGNOSIS — Z0001 Encounter for general adult medical examination with abnormal findings: Secondary | ICD-10-CM | POA: Diagnosis not present

## 2018-04-17 DIAGNOSIS — E1165 Type 2 diabetes mellitus with hyperglycemia: Secondary | ICD-10-CM | POA: Diagnosis not present

## 2018-07-31 DIAGNOSIS — E119 Type 2 diabetes mellitus without complications: Secondary | ICD-10-CM | POA: Diagnosis not present

## 2018-07-31 DIAGNOSIS — E782 Mixed hyperlipidemia: Secondary | ICD-10-CM | POA: Diagnosis not present

## 2018-07-31 DIAGNOSIS — Z23 Encounter for immunization: Secondary | ICD-10-CM | POA: Diagnosis not present

## 2018-07-31 DIAGNOSIS — J45909 Unspecified asthma, uncomplicated: Secondary | ICD-10-CM | POA: Diagnosis not present

## 2018-07-31 DIAGNOSIS — G4730 Sleep apnea, unspecified: Secondary | ICD-10-CM | POA: Diagnosis not present

## 2018-07-31 DIAGNOSIS — I10 Essential (primary) hypertension: Secondary | ICD-10-CM | POA: Diagnosis not present

## 2018-07-31 DIAGNOSIS — Z1389 Encounter for screening for other disorder: Secondary | ICD-10-CM | POA: Diagnosis not present

## 2018-07-31 DIAGNOSIS — M1991 Primary osteoarthritis, unspecified site: Secondary | ICD-10-CM | POA: Diagnosis not present

## 2018-08-01 ENCOUNTER — Other Ambulatory Visit (HOSPITAL_COMMUNITY): Payer: Self-pay | Admitting: Family Medicine

## 2018-08-01 DIAGNOSIS — Z1231 Encounter for screening mammogram for malignant neoplasm of breast: Secondary | ICD-10-CM

## 2018-08-04 ENCOUNTER — Ambulatory Visit (HOSPITAL_COMMUNITY): Payer: Medicare Other

## 2018-08-20 IMAGING — DX DG CHEST 2V
2 series · 2 of 2 positions shown · non-contrast
Comparison: Chest x-ray of November 20, 2008

CLINICAL DATA: Two days of right middle lower anterior rib cage
pain; the patient reports one-month history of cough and shortness
of breath. History of asthma and diabetes, nonsmoker.

EXAM:
CHEST  2 VIEW

[chest pa]
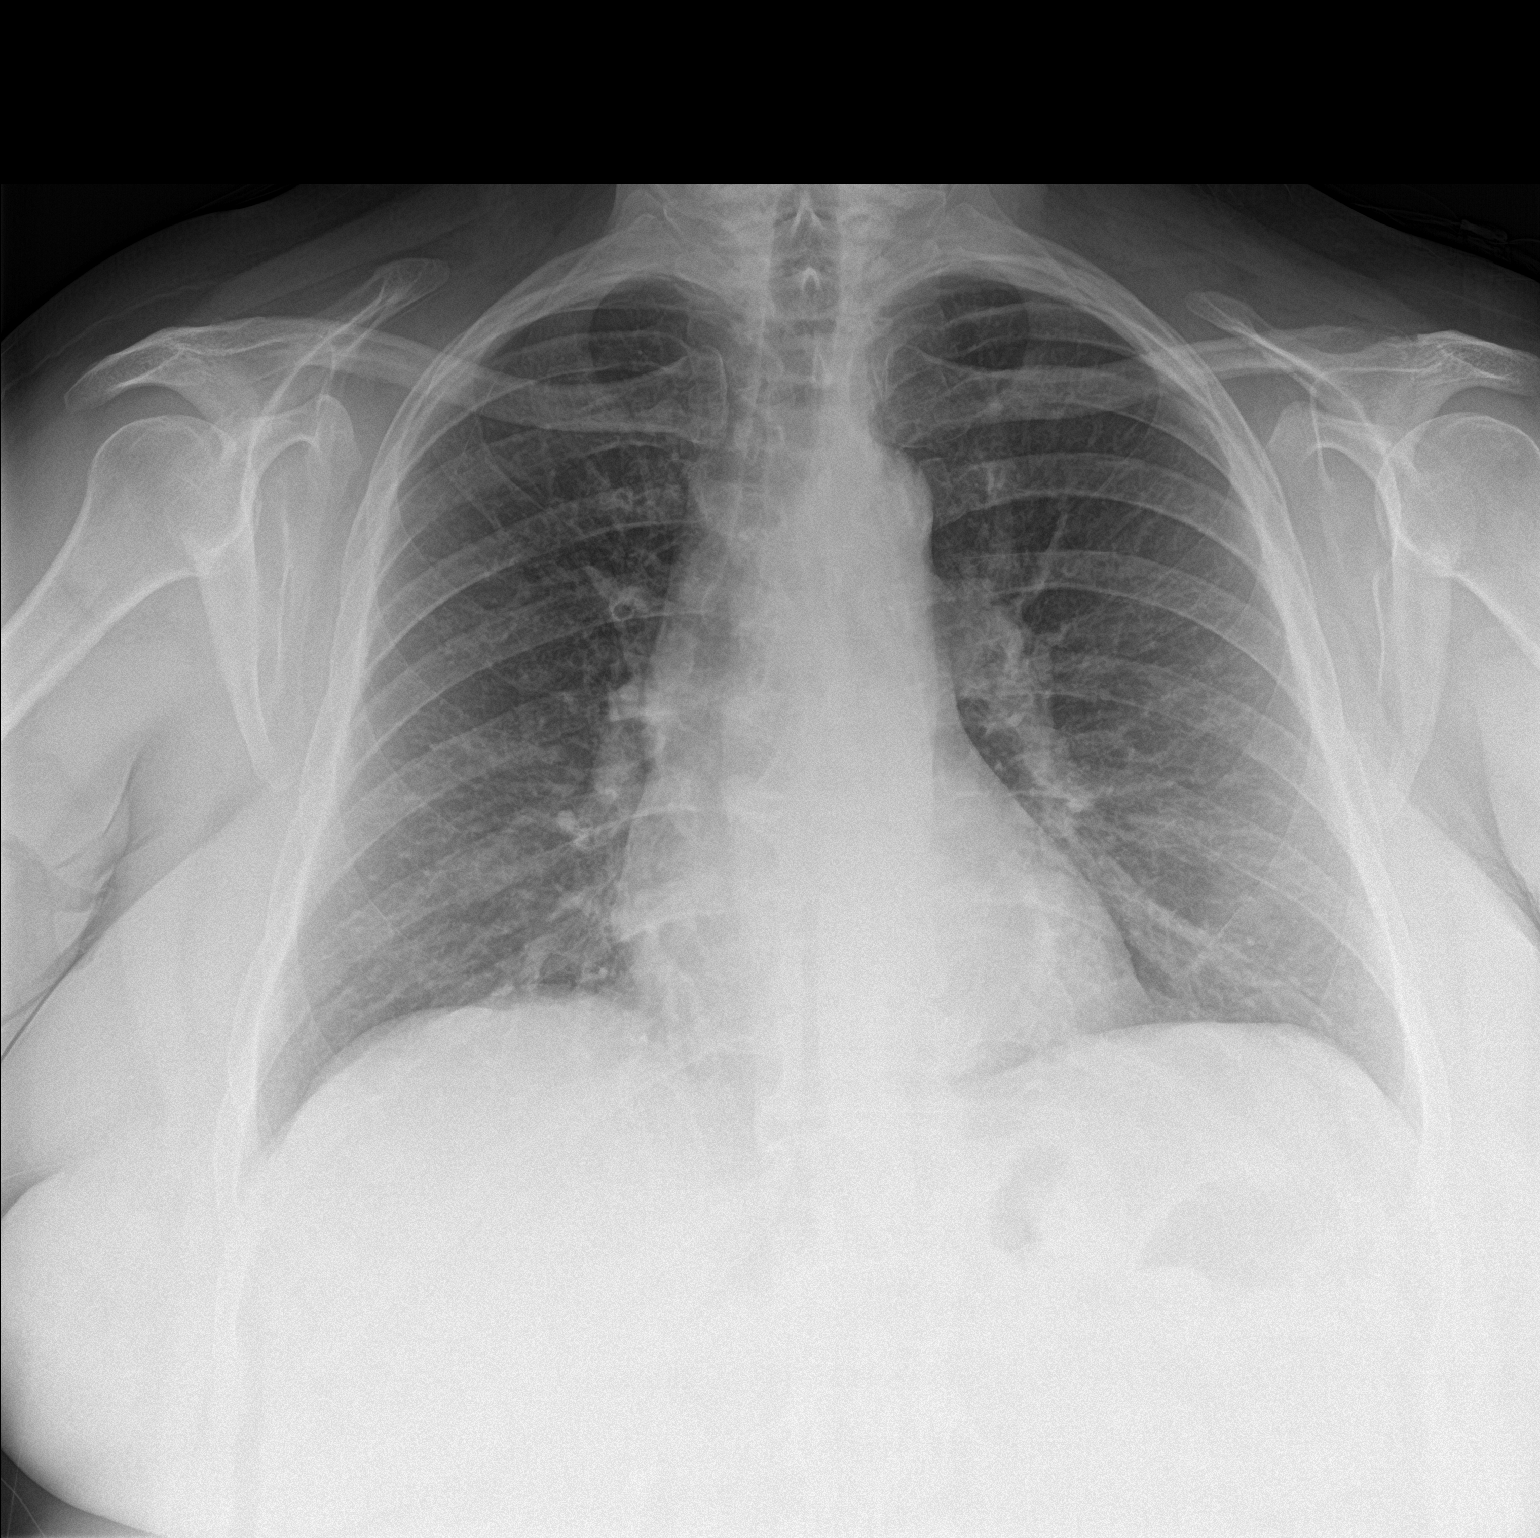

[chest lat]
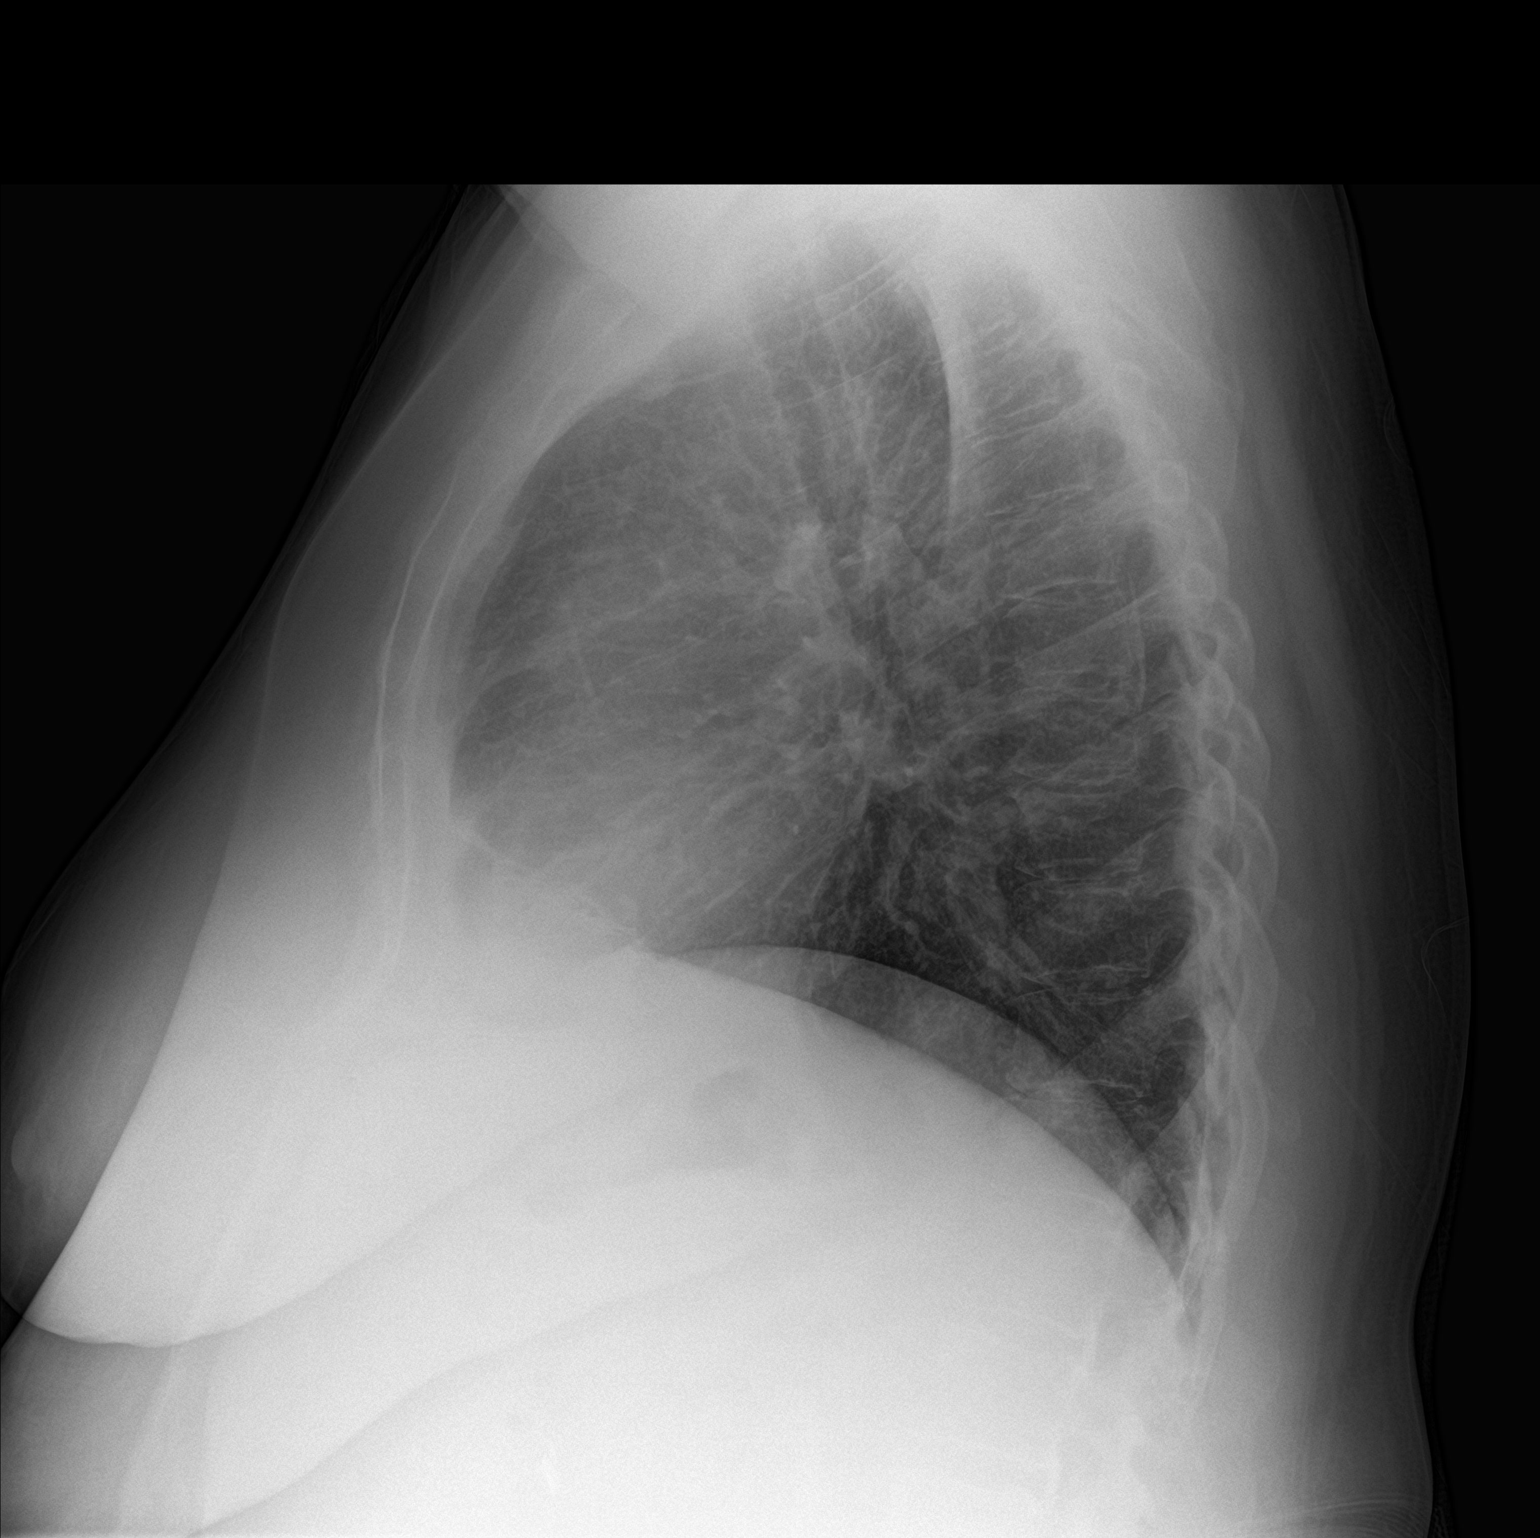

[2 of 2 positions shown; findings below may reference images not displayed]

FINDINGS: The lungs are well-expanded. There are linear increased densities in
the anterior aspect of the lingula. The heart and pulmonary
vascularity are normal. The mediastinum is normal in width. There is
no pleural effusion. The observed bony thorax exhibits no acute
abnormality.
IMPRESSION: Atelectasis or early interstitial infiltrate in the lingula.
Followup PA and lateral chest X-ray is recommended in 3-4 weeks
following trial of antibiotic therapy to ensure resolution.

## 2018-08-20 IMAGING — DX DG RIBS 2V*R*
4 series · 4 of 4 positions shown · non-contrast
Comparison: Chest x-ray of today's date

CLINICAL DATA: Right lower anterior rib cage pain for the past 2
days. The patient or ports a cough and shortness of breath for the
past month. History of asthma. Nonsmoker.

EXAM:
RIGHT RIBS - 2 VIEW

[rib pa (1 of 2)]
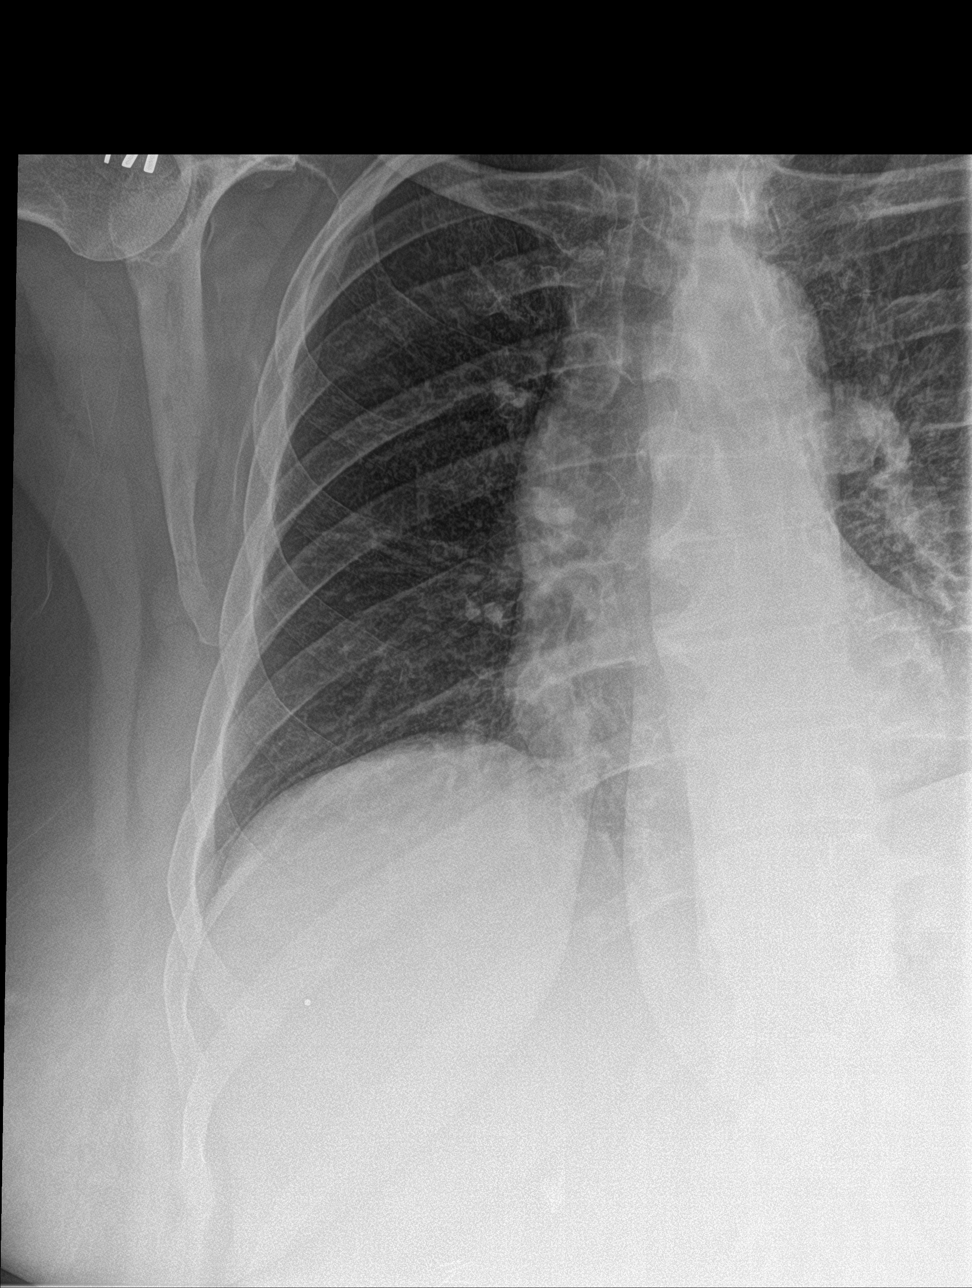

[rib pa obl (1 of 2)]
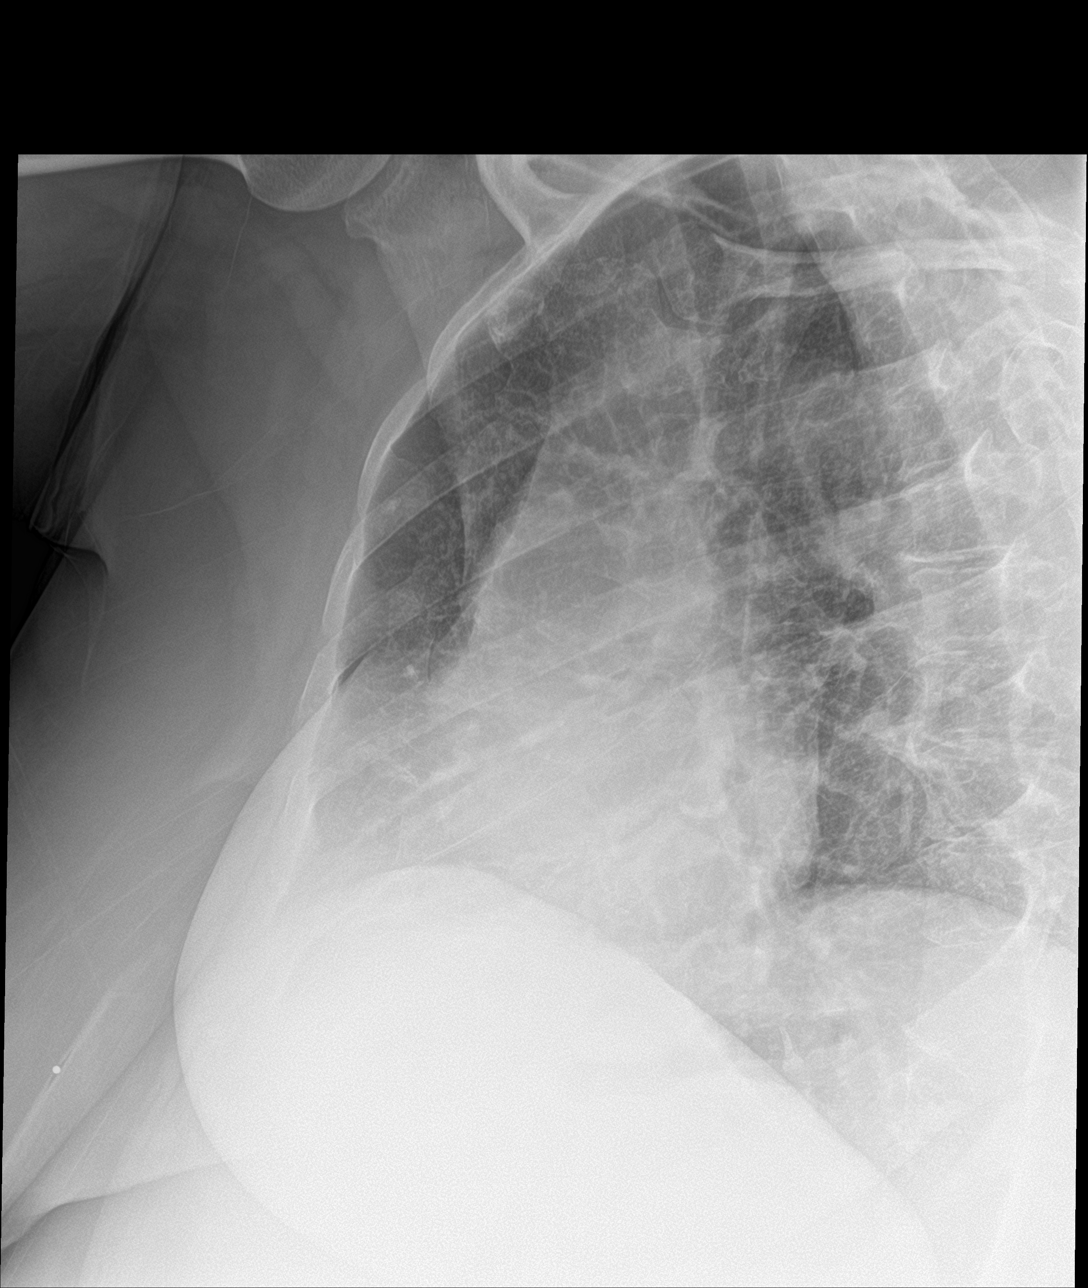

[rib pa obl (2 of 2)]
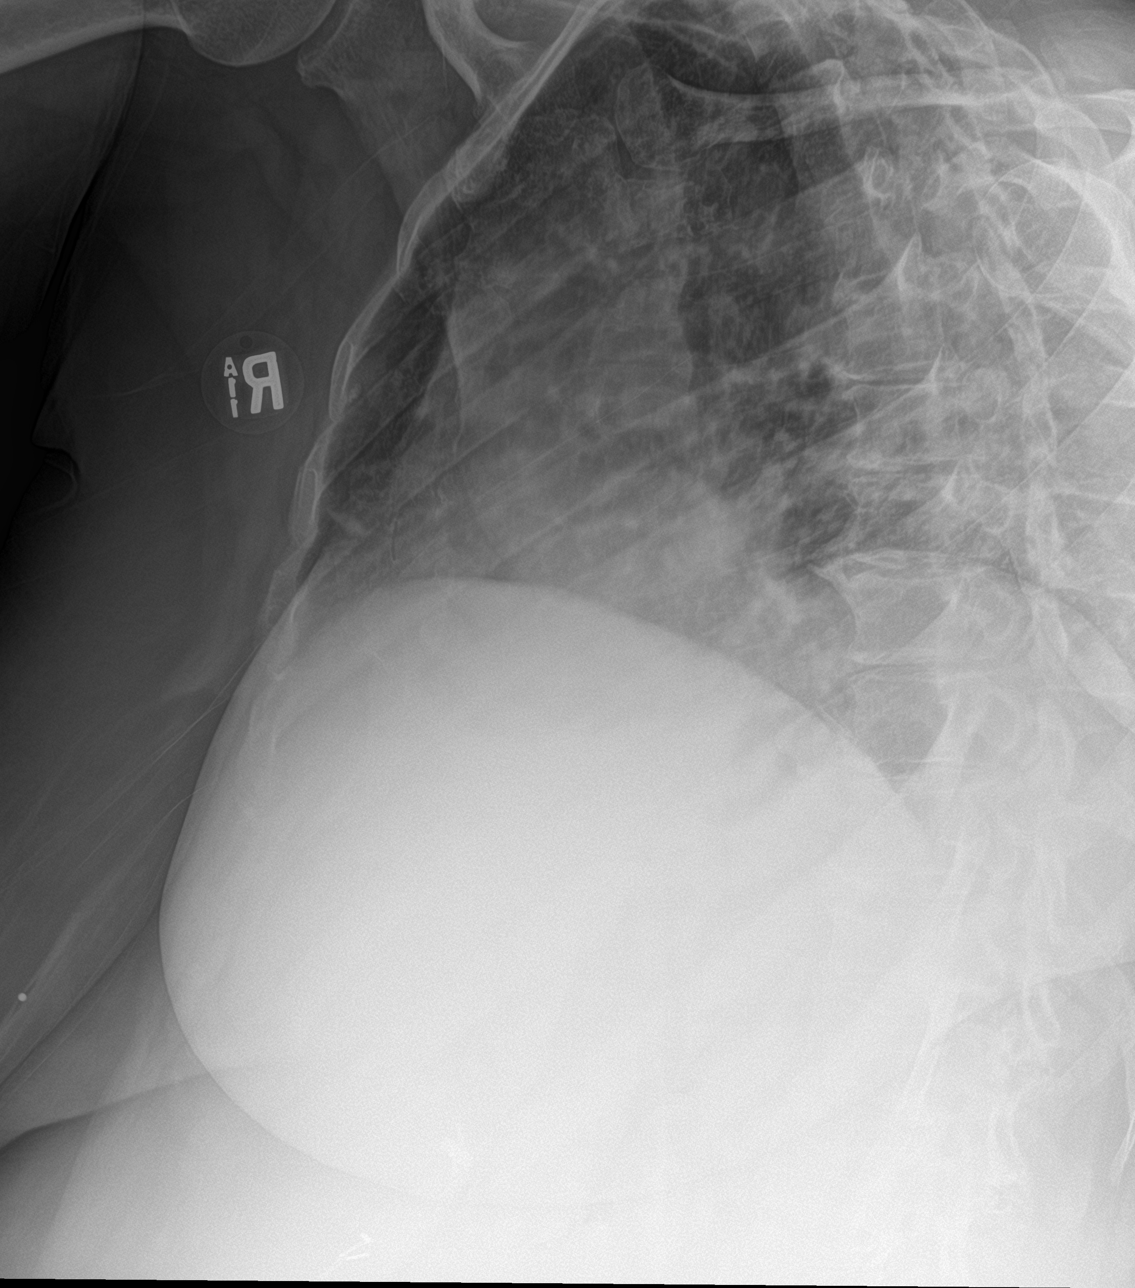

[rib pa (2 of 2)]
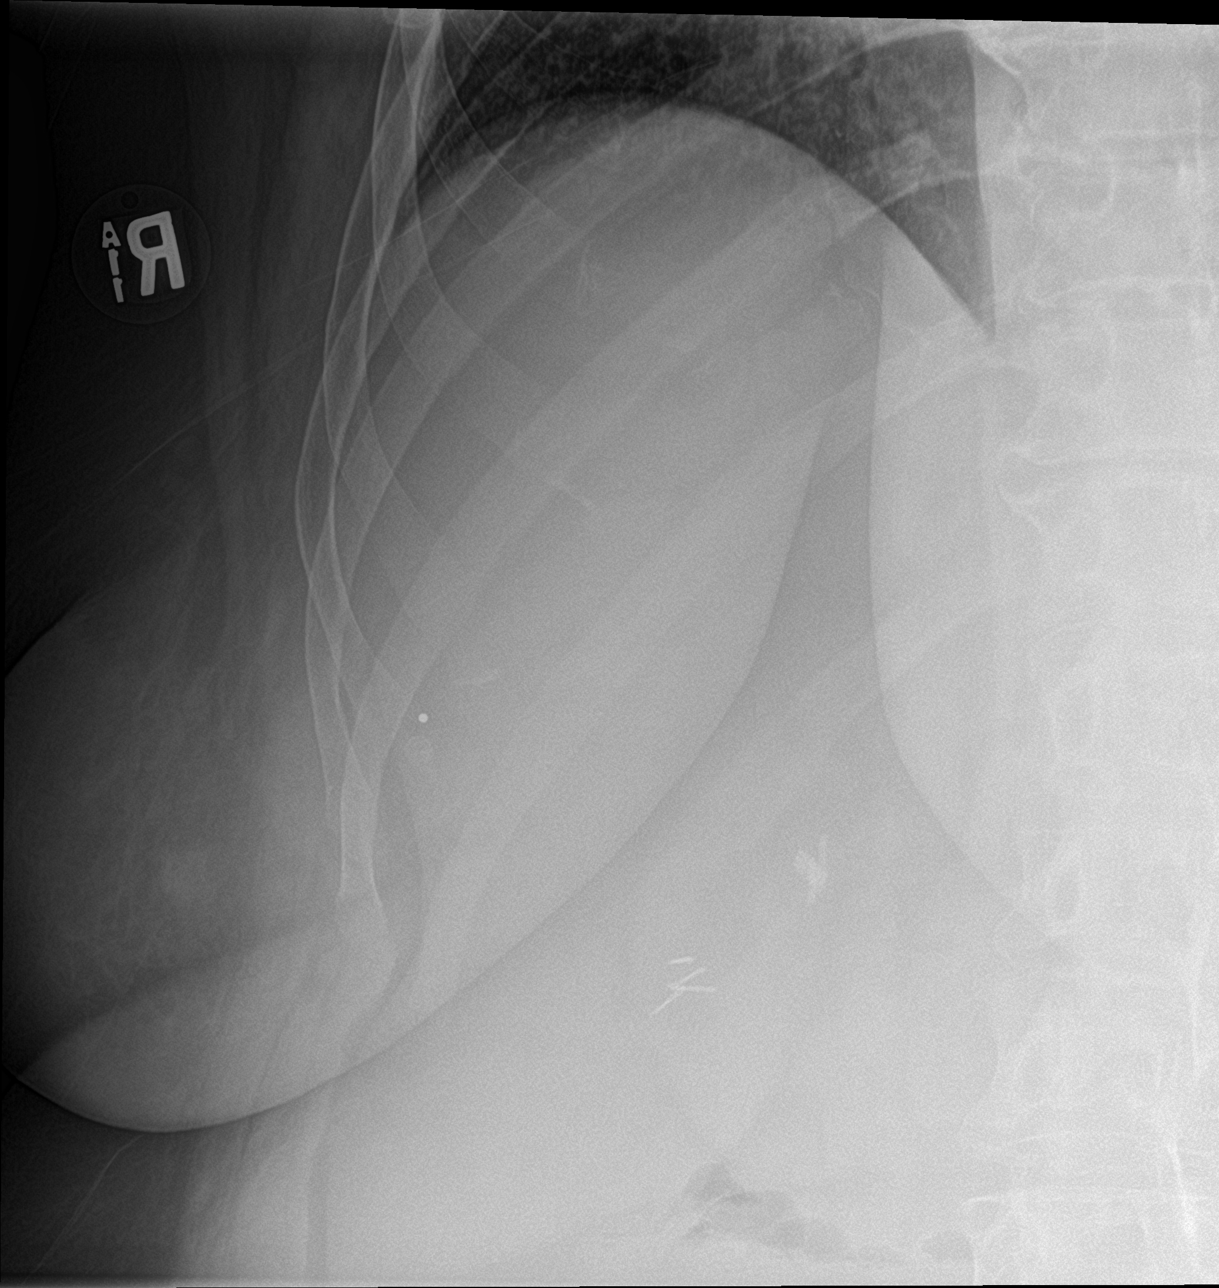

[4 of 4 positions shown; findings below may reference images not displayed]

FINDINGS: The visualized right ribs are adequately mineralized. A metallic BB
is been placed over the area of symptoms. There is no acute fracture
nor dislocation.
IMPRESSION: No acute right rib fracture is observed. There is no pleural
effusion or pneumothorax.

## 2018-08-23 ENCOUNTER — Ambulatory Visit (HOSPITAL_COMMUNITY)
Admission: RE | Admit: 2018-08-23 | Discharge: 2018-08-23 | Disposition: A | Discharge status: Home / Self Care | Payer: Medicare Other | Source: Ambulatory Visit | Attending: Family Medicine | Admitting: Family Medicine

## 2018-08-23 DIAGNOSIS — Z1231 Encounter for screening mammogram for malignant neoplasm of breast: Secondary | ICD-10-CM | POA: Diagnosis not present

## 2018-09-11 DIAGNOSIS — E039 Hypothyroidism, unspecified: Secondary | ICD-10-CM | POA: Diagnosis not present

## 2018-09-20 DIAGNOSIS — H524 Presbyopia: Secondary | ICD-10-CM | POA: Diagnosis not present

## 2018-09-20 DIAGNOSIS — H5203 Hypermetropia, bilateral: Secondary | ICD-10-CM | POA: Diagnosis not present

## 2018-09-20 DIAGNOSIS — E119 Type 2 diabetes mellitus without complications: Secondary | ICD-10-CM | POA: Diagnosis not present

## 2018-09-20 DIAGNOSIS — H52223 Regular astigmatism, bilateral: Secondary | ICD-10-CM | POA: Diagnosis not present

## 2018-12-05 DIAGNOSIS — E785 Hyperlipidemia, unspecified: Secondary | ICD-10-CM | POA: Diagnosis not present

## 2018-12-05 DIAGNOSIS — E119 Type 2 diabetes mellitus without complications: Secondary | ICD-10-CM | POA: Diagnosis not present

## 2018-12-05 DIAGNOSIS — E1140 Type 2 diabetes mellitus with diabetic neuropathy, unspecified: Secondary | ICD-10-CM | POA: Diagnosis not present

## 2018-12-05 DIAGNOSIS — I10 Essential (primary) hypertension: Secondary | ICD-10-CM | POA: Diagnosis not present

## 2018-12-05 DIAGNOSIS — Z1389 Encounter for screening for other disorder: Secondary | ICD-10-CM | POA: Diagnosis not present

## 2018-12-05 DIAGNOSIS — Z0001 Encounter for general adult medical examination with abnormal findings: Secondary | ICD-10-CM | POA: Diagnosis not present

## 2018-12-05 DIAGNOSIS — E039 Hypothyroidism, unspecified: Secondary | ICD-10-CM | POA: Diagnosis not present

## 2019-04-04 DIAGNOSIS — Z1389 Encounter for screening for other disorder: Secondary | ICD-10-CM | POA: Diagnosis not present

## 2019-04-04 DIAGNOSIS — E785 Hyperlipidemia, unspecified: Secondary | ICD-10-CM | POA: Diagnosis not present

## 2019-04-04 DIAGNOSIS — E039 Hypothyroidism, unspecified: Secondary | ICD-10-CM | POA: Diagnosis not present

## 2019-04-04 DIAGNOSIS — I10 Essential (primary) hypertension: Secondary | ICD-10-CM | POA: Diagnosis not present

## 2019-04-04 DIAGNOSIS — E1140 Type 2 diabetes mellitus with diabetic neuropathy, unspecified: Secondary | ICD-10-CM | POA: Diagnosis not present

## 2019-04-04 DIAGNOSIS — G4730 Sleep apnea, unspecified: Secondary | ICD-10-CM | POA: Diagnosis not present

## 2019-05-16 DIAGNOSIS — E039 Hypothyroidism, unspecified: Secondary | ICD-10-CM | POA: Diagnosis not present

## 2019-07-04 ENCOUNTER — Other Ambulatory Visit (HOSPITAL_COMMUNITY): Payer: Self-pay | Admitting: Family Medicine

## 2019-07-04 DIAGNOSIS — I10 Essential (primary) hypertension: Secondary | ICD-10-CM | POA: Diagnosis not present

## 2019-07-04 DIAGNOSIS — E1165 Type 2 diabetes mellitus with hyperglycemia: Secondary | ICD-10-CM | POA: Diagnosis not present

## 2019-07-04 DIAGNOSIS — E7849 Other hyperlipidemia: Secondary | ICD-10-CM | POA: Diagnosis not present

## 2019-07-04 DIAGNOSIS — M1991 Primary osteoarthritis, unspecified site: Secondary | ICD-10-CM | POA: Diagnosis not present

## 2019-07-04 DIAGNOSIS — IMO0002 Reserved for concepts with insufficient information to code with codable children: Secondary | ICD-10-CM

## 2019-07-09 ENCOUNTER — Other Ambulatory Visit (HOSPITAL_COMMUNITY): Payer: Self-pay | Admitting: Family Medicine

## 2019-07-09 DIAGNOSIS — N63 Unspecified lump in unspecified breast: Secondary | ICD-10-CM

## 2019-07-17 ENCOUNTER — Ambulatory Visit (HOSPITAL_COMMUNITY)
Admission: RE | Admit: 2019-07-17 | Discharge: 2019-07-17 | Disposition: A | Discharge status: Home / Self Care | Payer: Medicare Other | Source: Ambulatory Visit | Attending: Family Medicine | Admitting: Family Medicine

## 2019-07-17 ENCOUNTER — Other Ambulatory Visit: Payer: Self-pay

## 2019-07-17 DIAGNOSIS — N6332 Unspecified lump in axillary tail of the left breast: Secondary | ICD-10-CM | POA: Diagnosis not present

## 2019-07-17 DIAGNOSIS — N6320 Unspecified lump in the left breast, unspecified quadrant: Secondary | ICD-10-CM | POA: Diagnosis present

## 2019-07-17 DIAGNOSIS — N63 Unspecified lump in unspecified breast: Secondary | ICD-10-CM

## 2019-07-17 DIAGNOSIS — R928 Other abnormal and inconclusive findings on diagnostic imaging of breast: Secondary | ICD-10-CM | POA: Diagnosis not present

## 2019-07-17 DIAGNOSIS — IMO0002 Reserved for concepts with insufficient information to code with codable children: Secondary | ICD-10-CM

## 2019-07-17 DIAGNOSIS — N6489 Other specified disorders of breast: Secondary | ICD-10-CM | POA: Diagnosis not present

## 2019-08-09 DIAGNOSIS — Z23 Encounter for immunization: Secondary | ICD-10-CM | POA: Diagnosis not present

## 2019-09-24 DIAGNOSIS — E119 Type 2 diabetes mellitus without complications: Secondary | ICD-10-CM | POA: Diagnosis not present

## 2019-10-12 ENCOUNTER — Other Ambulatory Visit: Payer: Self-pay

## 2019-10-12 DIAGNOSIS — Z20822 Contact with and (suspected) exposure to covid-19: Secondary | ICD-10-CM

## 2019-10-15 ENCOUNTER — Ambulatory Visit: Payer: Self-pay

## 2019-10-15 ENCOUNTER — Telehealth: Payer: Self-pay | Admitting: *Deleted

## 2019-10-15 LAB — NOVEL CORONAVIRUS, NAA: SARS-CoV-2, NAA: NOT DETECTED

## 2019-10-15 NOTE — Telephone Encounter (Signed)
Results not available

## 2019-10-15 NOTE — Telephone Encounter (Signed)
Calling for covid results, negative, verbalizes understanding.

## 2019-10-31 DIAGNOSIS — J069 Acute upper respiratory infection, unspecified: Secondary | ICD-10-CM | POA: Diagnosis not present

## 2019-11-05 DIAGNOSIS — E1140 Type 2 diabetes mellitus with diabetic neuropathy, unspecified: Secondary | ICD-10-CM | POA: Diagnosis not present

## 2019-11-05 DIAGNOSIS — U071 COVID-19: Secondary | ICD-10-CM | POA: Diagnosis not present

## 2019-11-05 DIAGNOSIS — I10 Essential (primary) hypertension: Secondary | ICD-10-CM | POA: Diagnosis not present

## 2019-11-05 DIAGNOSIS — R05 Cough: Secondary | ICD-10-CM | POA: Diagnosis not present

## 2019-11-05 DIAGNOSIS — E1165 Type 2 diabetes mellitus with hyperglycemia: Secondary | ICD-10-CM | POA: Diagnosis not present

## 2019-11-08 DIAGNOSIS — E039 Hypothyroidism, unspecified: Secondary | ICD-10-CM | POA: Diagnosis not present

## 2019-11-08 DIAGNOSIS — J45909 Unspecified asthma, uncomplicated: Secondary | ICD-10-CM | POA: Diagnosis not present

## 2019-11-08 DIAGNOSIS — E1140 Type 2 diabetes mellitus with diabetic neuropathy, unspecified: Secondary | ICD-10-CM | POA: Diagnosis not present

## 2019-11-08 DIAGNOSIS — I10 Essential (primary) hypertension: Secondary | ICD-10-CM | POA: Diagnosis not present

## 2019-11-23 DIAGNOSIS — E559 Vitamin D deficiency, unspecified: Secondary | ICD-10-CM | POA: Diagnosis not present

## 2019-11-23 DIAGNOSIS — E7849 Other hyperlipidemia: Secondary | ICD-10-CM | POA: Diagnosis not present

## 2019-11-23 DIAGNOSIS — U071 COVID-19: Secondary | ICD-10-CM | POA: Diagnosis not present

## 2019-11-23 DIAGNOSIS — Z681 Body mass index (BMI) 19 or less, adult: Secondary | ICD-10-CM | POA: Diagnosis not present

## 2019-11-23 DIAGNOSIS — I10 Essential (primary) hypertension: Secondary | ICD-10-CM | POA: Diagnosis not present

## 2019-11-23 DIAGNOSIS — E1140 Type 2 diabetes mellitus with diabetic neuropathy, unspecified: Secondary | ICD-10-CM | POA: Diagnosis not present

## 2019-12-05 ENCOUNTER — Ambulatory Visit: Payer: Medicare Other | Attending: Internal Medicine

## 2019-12-05 DIAGNOSIS — Z20822 Contact with and (suspected) exposure to covid-19: Secondary | ICD-10-CM | POA: Diagnosis not present

## 2019-12-06 LAB — NOVEL CORONAVIRUS, NAA: SARS-CoV-2, NAA: NOT DETECTED

## 2019-12-09 DIAGNOSIS — E1140 Type 2 diabetes mellitus with diabetic neuropathy, unspecified: Secondary | ICD-10-CM | POA: Diagnosis not present

## 2019-12-09 DIAGNOSIS — J45909 Unspecified asthma, uncomplicated: Secondary | ICD-10-CM | POA: Diagnosis not present

## 2019-12-09 DIAGNOSIS — E039 Hypothyroidism, unspecified: Secondary | ICD-10-CM | POA: Diagnosis not present

## 2019-12-10 ENCOUNTER — Telehealth: Payer: Self-pay | Admitting: *Deleted

## 2019-12-10 NOTE — Telephone Encounter (Signed)
Pt called requesting her COVID result from 12/05/19 be faxed to [phone] ATTN: Dr Phillips Odor

## 2019-12-14 DIAGNOSIS — U071 COVID-19: Secondary | ICD-10-CM | POA: Diagnosis not present

## 2019-12-14 DIAGNOSIS — J45909 Unspecified asthma, uncomplicated: Secondary | ICD-10-CM | POA: Diagnosis not present

## 2019-12-14 DIAGNOSIS — G4730 Sleep apnea, unspecified: Secondary | ICD-10-CM | POA: Diagnosis not present

## 2019-12-14 DIAGNOSIS — J309 Allergic rhinitis, unspecified: Secondary | ICD-10-CM | POA: Diagnosis not present

## 2019-12-14 DIAGNOSIS — E119 Type 2 diabetes mellitus without complications: Secondary | ICD-10-CM | POA: Diagnosis not present

## 2019-12-14 DIAGNOSIS — E1140 Type 2 diabetes mellitus with diabetic neuropathy, unspecified: Secondary | ICD-10-CM | POA: Diagnosis not present

## 2019-12-14 DIAGNOSIS — Z0001 Encounter for general adult medical examination with abnormal findings: Secondary | ICD-10-CM | POA: Diagnosis not present

## 2019-12-14 DIAGNOSIS — I10 Essential (primary) hypertension: Secondary | ICD-10-CM | POA: Diagnosis not present

## 2019-12-14 DIAGNOSIS — Z1389 Encounter for screening for other disorder: Secondary | ICD-10-CM | POA: Diagnosis not present

## 2019-12-14 DIAGNOSIS — E785 Hyperlipidemia, unspecified: Secondary | ICD-10-CM | POA: Diagnosis not present

## 2020-01-22 ENCOUNTER — Other Ambulatory Visit: Payer: Self-pay

## 2020-01-22 NOTE — Patient Outreach (Signed)
[facility]) Care Management  01/22/2020  CRETA BARTCH [birth year] [id number]   Medication Adherence call to Mrs. [place] Compliant Voice message left with a call back number. Mrs. Drach is showing past due on Metformin 1000 mg under [facility].   [facility] Direct Dial [phone]  Fax [phone] [email or web address]

## 2020-02-06 DIAGNOSIS — E039 Hypothyroidism, unspecified: Secondary | ICD-10-CM | POA: Diagnosis not present

## 2020-02-06 DIAGNOSIS — I10 Essential (primary) hypertension: Secondary | ICD-10-CM | POA: Diagnosis not present

## 2020-02-06 DIAGNOSIS — J45909 Unspecified asthma, uncomplicated: Secondary | ICD-10-CM | POA: Diagnosis not present

## 2020-02-06 DIAGNOSIS — E1140 Type 2 diabetes mellitus with diabetic neuropathy, unspecified: Secondary | ICD-10-CM | POA: Diagnosis not present

## 2020-03-07 DIAGNOSIS — J45909 Unspecified asthma, uncomplicated: Secondary | ICD-10-CM | POA: Diagnosis not present

## 2020-03-07 DIAGNOSIS — E039 Hypothyroidism, unspecified: Secondary | ICD-10-CM | POA: Diagnosis not present

## 2020-03-07 DIAGNOSIS — E1140 Type 2 diabetes mellitus with diabetic neuropathy, unspecified: Secondary | ICD-10-CM | POA: Diagnosis not present

## 2020-03-10 DIAGNOSIS — I10 Essential (primary) hypertension: Secondary | ICD-10-CM | POA: Diagnosis not present

## 2020-03-10 DIAGNOSIS — E1165 Type 2 diabetes mellitus with hyperglycemia: Secondary | ICD-10-CM | POA: Diagnosis not present

## 2020-03-10 DIAGNOSIS — E7849 Other hyperlipidemia: Secondary | ICD-10-CM | POA: Diagnosis not present

## 2020-03-10 DIAGNOSIS — E119 Type 2 diabetes mellitus without complications: Secondary | ICD-10-CM | POA: Diagnosis not present

## 2020-04-07 DIAGNOSIS — E039 Hypothyroidism, unspecified: Secondary | ICD-10-CM | POA: Diagnosis not present

## 2020-04-07 DIAGNOSIS — J45909 Unspecified asthma, uncomplicated: Secondary | ICD-10-CM | POA: Diagnosis not present

## 2020-04-07 DIAGNOSIS — E1140 Type 2 diabetes mellitus with diabetic neuropathy, unspecified: Secondary | ICD-10-CM | POA: Diagnosis not present

## 2020-05-07 DIAGNOSIS — E1140 Type 2 diabetes mellitus with diabetic neuropathy, unspecified: Secondary | ICD-10-CM | POA: Diagnosis not present

## 2020-05-07 DIAGNOSIS — J45909 Unspecified asthma, uncomplicated: Secondary | ICD-10-CM | POA: Diagnosis not present

## 2020-05-07 DIAGNOSIS — E039 Hypothyroidism, unspecified: Secondary | ICD-10-CM | POA: Diagnosis not present

## 2020-06-02 ENCOUNTER — Other Ambulatory Visit: Payer: Self-pay

## 2020-06-02 ENCOUNTER — Ambulatory Visit
Admission: EM | Admit: 2020-06-02 | Discharge: 2020-06-02 | Disposition: A | Discharge status: Home / Self Care | Payer: Medicare Other

## 2020-06-02 ENCOUNTER — Encounter: Payer: Self-pay | Admitting: Emergency Medicine

## 2020-06-02 DIAGNOSIS — R200 Anesthesia of skin: Secondary | ICD-10-CM

## 2020-06-02 DIAGNOSIS — R202 Paresthesia of skin: Secondary | ICD-10-CM

## 2020-06-02 DIAGNOSIS — M79601 Pain in right arm: Secondary | ICD-10-CM | POA: Diagnosis not present

## 2020-06-02 DIAGNOSIS — M5412 Radiculopathy, cervical region: Secondary | ICD-10-CM

## 2020-06-02 MED ORDER — KETOROLAC TROMETHAMINE 30 MG/ML IJ SOLN
30.0000 mg | Freq: Once | INTRAMUSCULAR | Status: AC
  Administered 2020-06-02: 30 mg via INTRAMUSCULAR

## 2020-06-02 MED ORDER — DEXAMETHASONE SODIUM PHOSPHATE 10 MG/ML IJ SOLN
10.0000 mg | Freq: Once | INTRAMUSCULAR | Status: AC
  Administered 2020-06-02: 10 mg via INTRAMUSCULAR

## 2020-06-02 NOTE — ED Triage Notes (Signed)
Pt reports she started having pain in the back of her neck x 3 days ago. Pain radiates to RT arm causing it to throb and tingle.

## 2020-06-02 NOTE — Discharge Instructions (Addendum)
You have received an injection for pain and a steroid injection in the office today  This will help with the inflammation around your nerve that is causing the pain and tingling  I have sent in a muscle relaxer as well for you to use twice daily as needed for spasms  Follow up with this office or with primary care if you are not improving within the week

## 2020-06-02 NOTE — ED Provider Notes (Signed)
[place]   [id number] 06/02/20 Arrival Time: 0865  HQ:IONGE PAIN  SUBJECTIVE: History from: patient. [name] is a 59 y.o. female complains of right arm and hand tingling and numbness for the last  3 days. Reports that the pain originates at the right neck. Denies a precipitating event or specific injury. Does reports that she has a lot of stress in her life right now. Reports that the pain is the worst when she wakes up in the morning. Has tried OTC medications without relief. Symptoms are made worse with activity.  Denies similar symptoms in the past.  Denies fever, chills, erythema, ecchymosis, effusion, weakness, saddle paresthesias, loss of bowel or bladder function.      ROS: As per HPI.  All other pertinent ROS negative.     Past Medical History:  Diagnosis Date  . Anxiety   . Asthma   . Back pain   . Cancer ([place])    skin cancer of nose-2002  . Cholesterol serum elevated   . Depression   . Diabetes mellitus    Type II  . GERD (gastroesophageal reflux disease)   . HTN (hypertension)   . Hyperlipidemia   . Reflux   . Restless leg syndrome   . Sleep apnea   . Tubular adenoma of colon 06/17/09   Arnoldo Morale   Past Surgical History:  Procedure Laterality Date  . ABDOMINAL HYSTERECTOMY  89   vag hysterectomy-destefano  . APPENDECTOMY     with hysterectomy  . CARPAL TUNNEL RELEASE  07/14/2011   Procedure: CARPAL TUNNEL RELEASE;  Surgeon: Arther Abbott, MD;  Location: [facility];  Service: Orthopedics;  Laterality: Right;  . CHOLECYSTECTOMY  2009   aph-Ziegler  . COLONOSCOPY  06/17/09   Jenkins-(adequate prep)sessile tubular adenoma, otherwise normal/tubular adenoma  . GALLBLADDER SURGERY    . GANGLION CYST EXCISION  hand x 2  . SKIN CANCER EXCISION  on nose  . SKIN GRAFT  98   right calf-destefano  . TRIGGER FINGER RELEASE Right 12/18/2012   Procedure: RELEASE TRIGGER FINGER/A-1 PULLEY; right thumb;  Surgeon: Carole Civil, MD;  Location: [facility];   Service: Orthopedics;  Laterality: Right;  Marland Kitchen VAGINAL HYSTERECTOMY     Allergies  Allergen Reactions  . Ace Inhibitors   . Neosporin [Neomycin-Bacitracin Zn-Polymyx]     Pt is diabetic and was told not to use it  . Penicillins Hives    Has patient had a PCN reaction causing immediate rash, facial/tongue/throat swelling, SOB or lightheadedness with hypotension: Yes Has patient had a PCN reaction causing severe rash involving mucus membranes or skin necrosis: Yes Has patient had a PCN reaction that required hospitalization: No Has patient had a PCN reaction occurring within the last 10 years: No If all of the above answers are "NO", then may proceed with Cephalosporin use.  . Clarithromycin Rash  . Sulfa Drugs Cross Reactors Rash   Current Facility-Administered Medications on File Prior to Encounter  Medication Dose Route Frequency Provider Last Rate Last Admin  . methylPREDNISolone acetate (DEPO-MEDROL) injection 40 mg  40 mg Intra-articular Once Carole Civil, MD       Current Outpatient Medications on File Prior to Encounter  Medication Sig Dispense Refill  . ALPRAZolam (XANAX) 0.5 MG tablet Take 0.5 mg by mouth 4 (four) times daily as needed. Anxiety    . buPROPion (WELLBUTRIN XL) 150 MG 24 hr tablet Take 450 tablets by mouth daily.     . DULoxetine (CYMBALTA) 60 MG capsule  Take 60 mg by mouth daily.    . hydrochlorothiazide (HYDRODIURIL) 25 MG tablet Take 25 mg by mouth daily.    Marland Kitchen levothyroxine (SYNTHROID) 100 MCG tablet Take 100 mcg by mouth daily before breakfast.    . pioglitazone (ACTOS) 30 MG tablet Take 30 mg by mouth daily.    . sitaGLIPtin (JANUVIA) 100 MG tablet Take 100 mg by mouth daily.    Marland Kitchen aspirin 81 MG tablet Take 81 mg by mouth daily.     . Biotin 5000 MCG TABS Take 1 tablet by mouth daily.    . cyclobenzaprine (FLEXERIL) 10 MG tablet Take 10 mg by mouth 3 (three) times daily as needed. Muscle Spasms    . fexofenadine (ALLEGRA) 180 MG tablet Take 180 mg by  mouth daily.      . fluorometholone (FML) 0.1 % ophthalmic suspension Place 1 drop into both eyes 3 (three) times daily.    Marland Kitchen gabapentin (NEURONTIN) 300 MG capsule Take 1 capsule by mouth 3 (three) times daily as needed.     Marland Kitchen HYDROcodone-acetaminophen (NORCO) 5-325 MG tablet Take 1 tablet by mouth every 4 (four) hours as needed for moderate pain. 15 tablet 0  . losartan-hydrochlorothiazide (HYZAAR) 100-25 MG per tablet Take 1 tablet by mouth daily.    . meloxicam (MOBIC) 15 MG tablet Take 1 tablet by mouth daily.    . metFORMIN (GLUCOPHAGE) 1000 MG tablet Take 1,000 mg by mouth 2 (two) times daily.    Marland Kitchen omeprazole (PRILOSEC) 20 MG capsule Take 20 mg by mouth daily.      . ondansetron (ZOFRAN-ODT) 4 MG disintegrating tablet Take 1 tablet by mouth 3 (three) times daily as needed.    Marland Kitchen PRESCRIPTION MEDICATION Apply 1 application topically 4 (four) times daily as needed. Compound cream: Diclofenac sodium 3%, Baclofen 2%, Gabapentin 5%, Lidocaine 5%, Menthol 1%     Social History   Socioeconomic History  . Marital status: Widowed    Spouse name: Not on file  . Number of children: Not on file  . Years of education: 12th grade  . Highest education level: Not on file  Occupational History  . Occupation: sewing IT sales professional: [employer]  . Smoking status: Former Smoker    Packs/day: 0.25    Years: 2.00    Pack years: 0.50    Types: Cigarettes    Quit date: 07/08/1974    Years since quitting: 45.9  . Smokeless tobacco: Never Used  . Tobacco comment: Quit for 20 plus years  Substance and Sexual Activity  . Alcohol use: No  . Drug use: No  . Sexual activity: Yes    Birth control/protection: Surgical  Other Topics Concern  . Not on file  Social History Narrative  . Not on file   Social Determinants of Health   Financial Resource Strain:   . Difficulty of Paying Living Expenses:   Food Insecurity:   . Worried About Charity fundraiser in the  Last Year:   . Arboriculturist in the Last Year:   Transportation Needs:   . Film/video editor (Medical):   Marland Kitchen Lack of Transportation (Non-Medical):   Physical Activity:   . Days of Exercise per Week:   . Minutes of Exercise per Session:   Stress:   . Feeling of Stress :   Social Connections:   . Frequency of Communication with Friends and Family:   . Frequency of Social Gatherings  with Friends and Family:   . Attends Religious Services:   . Active Member of Clubs or Organizations:   . Attends Archivist Meetings:   Marland Kitchen Marital Status:   Intimate Partner Violence:   . Fear of Current or Ex-Partner:   . Emotionally Abused:   Marland Kitchen Physically Abused:   . Sexually Abused:    Family History  Problem Relation Age of Onset  . Arthritis Other   . Diabetes Other   . Anesthesia problems Neg Hx   . Hypotension Neg Hx   . Malignant hyperthermia Neg Hx   . Pseudochol deficiency Neg Hx     OBJECTIVE:  Vitals:   06/02/20 0906 06/02/20 0909  BP:  (!) 146/76  Pulse:  96  Resp:  17  Temp:  99.4 F (37.4 C)  TempSrc:  Oral  SpO2:  95%  Weight: (!) 299 lb 13.2 oz (136 kg)   Height: 5\' 7"  (1.702 m)     General appearance: ALERT; in no acute distress.  Head: NCAT Lungs: Normal respiratory effort CV:  pulses 2+ bilaterally. Cap refill < 2 seconds Musculoskeletal:  Inspection: Skin warm, dry, clear and intact without obvious erythema, effusion, or ecchymosis.  Palpation:right neck and shoulder tender to palpation ROM: limited ROM active and passive to right arm and neck Skin: warm and dry Neurologic: Ambulates without difficulty; Sensation intact about the upper/ lower extremities Psychological: alert and cooperative; normal mood and affect  DIAGNOSTIC STUDIES:  No results found.   ASSESSMENT & PLAN:  1. Cervical radiculopathy   2. Numbness and tingling of right arm   3. Right arm pain      Meds ordered this encounter  Medications  . ketorolac (TORADOL) 30  MG/ML injection 30 mg  . dexamethasone (DECADRON) injection 10 mg   Toradol 30mg  IM office today Decadron 10mg  IM in office today Declines oral steroids today, does not want to increase her blood sugars Continue conservative management of rest, ice, and gentle stretches Take naproxen as needed for pain relief (may cause abdominal discomfort, ulcers, and GI bleeds avoid taking with other NSAIDs) Take cyclobenzaprine at nighttime for symptomatic relief. Avoid driving or operating heavy machinery while using medication. May also use home gabapentin for nerve pain Follow up with PCP if symptoms persist Return or go to the ER if you have any new or worsening symptoms (fever, chills, chest pain, abdominal pain, changes in bowel or bladder habits, pain radiating into lower legs)    Controlled Substances Registry consulted for this patient. I feel the risk/benefit ratio today is favorable for proceeding with this prescription for a controlled substance. Medication sedation precautions given.  Reviewed expectations re: course of current medical issues. Questions answered. Outlined signs and symptoms indicating need for more acute intervention. Patient verbalized understanding. After Visit Summary given.       Faustino Congress, NP 06/08/20 1958

## 2020-06-06 DIAGNOSIS — J45909 Unspecified asthma, uncomplicated: Secondary | ICD-10-CM | POA: Diagnosis not present

## 2020-06-06 DIAGNOSIS — E1140 Type 2 diabetes mellitus with diabetic neuropathy, unspecified: Secondary | ICD-10-CM | POA: Diagnosis not present

## 2020-06-06 DIAGNOSIS — E039 Hypothyroidism, unspecified: Secondary | ICD-10-CM | POA: Diagnosis not present

## 2020-06-17 DIAGNOSIS — E1165 Type 2 diabetes mellitus with hyperglycemia: Secondary | ICD-10-CM | POA: Diagnosis not present

## 2020-06-17 DIAGNOSIS — E7849 Other hyperlipidemia: Secondary | ICD-10-CM | POA: Diagnosis not present

## 2020-06-17 DIAGNOSIS — Z1389 Encounter for screening for other disorder: Secondary | ICD-10-CM | POA: Diagnosis not present

## 2020-06-17 DIAGNOSIS — I10 Essential (primary) hypertension: Secondary | ICD-10-CM | POA: Diagnosis not present

## 2020-07-08 DIAGNOSIS — J45909 Unspecified asthma, uncomplicated: Secondary | ICD-10-CM | POA: Diagnosis not present

## 2020-07-08 DIAGNOSIS — E039 Hypothyroidism, unspecified: Secondary | ICD-10-CM | POA: Diagnosis not present

## 2020-07-08 DIAGNOSIS — E1140 Type 2 diabetes mellitus with diabetic neuropathy, unspecified: Secondary | ICD-10-CM | POA: Diagnosis not present

## 2020-08-07 DIAGNOSIS — J45909 Unspecified asthma, uncomplicated: Secondary | ICD-10-CM | POA: Diagnosis not present

## 2020-08-07 DIAGNOSIS — E1140 Type 2 diabetes mellitus with diabetic neuropathy, unspecified: Secondary | ICD-10-CM | POA: Diagnosis not present

## 2020-08-07 DIAGNOSIS — E039 Hypothyroidism, unspecified: Secondary | ICD-10-CM | POA: Diagnosis not present

## 2020-08-07 DIAGNOSIS — I10 Essential (primary) hypertension: Secondary | ICD-10-CM | POA: Diagnosis not present

## 2020-09-06 DIAGNOSIS — I10 Essential (primary) hypertension: Secondary | ICD-10-CM | POA: Diagnosis not present

## 2020-09-06 DIAGNOSIS — E1140 Type 2 diabetes mellitus with diabetic neuropathy, unspecified: Secondary | ICD-10-CM | POA: Diagnosis not present

## 2020-09-06 DIAGNOSIS — J45909 Unspecified asthma, uncomplicated: Secondary | ICD-10-CM | POA: Diagnosis not present

## 2020-09-06 DIAGNOSIS — E039 Hypothyroidism, unspecified: Secondary | ICD-10-CM | POA: Diagnosis not present

## 2020-09-19 DIAGNOSIS — E039 Hypothyroidism, unspecified: Secondary | ICD-10-CM | POA: Diagnosis not present

## 2020-09-19 DIAGNOSIS — I10 Essential (primary) hypertension: Secondary | ICD-10-CM | POA: Diagnosis not present

## 2020-09-19 DIAGNOSIS — E1140 Type 2 diabetes mellitus with diabetic neuropathy, unspecified: Secondary | ICD-10-CM | POA: Diagnosis not present

## 2020-09-19 DIAGNOSIS — E1142 Type 2 diabetes mellitus with diabetic polyneuropathy: Secondary | ICD-10-CM | POA: Diagnosis not present

## 2020-09-19 DIAGNOSIS — M1991 Primary osteoarthritis, unspecified site: Secondary | ICD-10-CM | POA: Diagnosis not present

## 2020-10-07 DIAGNOSIS — E039 Hypothyroidism, unspecified: Secondary | ICD-10-CM | POA: Diagnosis not present

## 2020-10-07 DIAGNOSIS — E1140 Type 2 diabetes mellitus with diabetic neuropathy, unspecified: Secondary | ICD-10-CM | POA: Diagnosis not present

## 2020-10-07 DIAGNOSIS — I10 Essential (primary) hypertension: Secondary | ICD-10-CM | POA: Diagnosis not present

## 2020-10-07 DIAGNOSIS — J45909 Unspecified asthma, uncomplicated: Secondary | ICD-10-CM | POA: Diagnosis not present

## 2020-11-07 DIAGNOSIS — E1140 Type 2 diabetes mellitus with diabetic neuropathy, unspecified: Secondary | ICD-10-CM | POA: Diagnosis not present

## 2020-11-07 DIAGNOSIS — I10 Essential (primary) hypertension: Secondary | ICD-10-CM | POA: Diagnosis not present

## 2020-11-07 DIAGNOSIS — J45909 Unspecified asthma, uncomplicated: Secondary | ICD-10-CM | POA: Diagnosis not present

## 2020-11-07 DIAGNOSIS — E039 Hypothyroidism, unspecified: Secondary | ICD-10-CM | POA: Diagnosis not present

## 2020-11-19 DIAGNOSIS — Z0100 Encounter for examination of eyes and vision without abnormal findings: Secondary | ICD-10-CM | POA: Diagnosis not present

## 2020-12-23 DIAGNOSIS — E785 Hyperlipidemia, unspecified: Secondary | ICD-10-CM | POA: Diagnosis not present

## 2020-12-23 DIAGNOSIS — I10 Essential (primary) hypertension: Secondary | ICD-10-CM | POA: Diagnosis not present

## 2020-12-23 DIAGNOSIS — E119 Type 2 diabetes mellitus without complications: Secondary | ICD-10-CM | POA: Diagnosis not present

## 2020-12-23 DIAGNOSIS — M1991 Primary osteoarthritis, unspecified site: Secondary | ICD-10-CM | POA: Diagnosis not present

## 2020-12-23 DIAGNOSIS — Z6841 Body Mass Index (BMI) 40.0 and over, adult: Secondary | ICD-10-CM | POA: Diagnosis not present

## 2020-12-23 DIAGNOSIS — E1140 Type 2 diabetes mellitus with diabetic neuropathy, unspecified: Secondary | ICD-10-CM | POA: Diagnosis not present

## 2020-12-23 DIAGNOSIS — Z1389 Encounter for screening for other disorder: Secondary | ICD-10-CM | POA: Diagnosis not present

## 2020-12-23 DIAGNOSIS — E039 Hypothyroidism, unspecified: Secondary | ICD-10-CM | POA: Diagnosis not present

## 2020-12-23 DIAGNOSIS — E7849 Other hyperlipidemia: Secondary | ICD-10-CM | POA: Diagnosis not present

## 2020-12-23 DIAGNOSIS — Z1331 Encounter for screening for depression: Secondary | ICD-10-CM | POA: Diagnosis not present

## 2020-12-23 DIAGNOSIS — Z0001 Encounter for general adult medical examination with abnormal findings: Secondary | ICD-10-CM | POA: Diagnosis not present

## 2021-01-05 DIAGNOSIS — E1140 Type 2 diabetes mellitus with diabetic neuropathy, unspecified: Secondary | ICD-10-CM | POA: Diagnosis not present

## 2021-01-05 DIAGNOSIS — I10 Essential (primary) hypertension: Secondary | ICD-10-CM | POA: Diagnosis not present

## 2021-01-05 DIAGNOSIS — E7849 Other hyperlipidemia: Secondary | ICD-10-CM | POA: Diagnosis not present

## 2021-01-05 DIAGNOSIS — J45909 Unspecified asthma, uncomplicated: Secondary | ICD-10-CM | POA: Diagnosis not present

## 2021-01-05 DIAGNOSIS — E039 Hypothyroidism, unspecified: Secondary | ICD-10-CM | POA: Diagnosis not present

## 2021-02-04 DIAGNOSIS — E039 Hypothyroidism, unspecified: Secondary | ICD-10-CM | POA: Diagnosis not present

## 2021-02-04 DIAGNOSIS — E1140 Type 2 diabetes mellitus with diabetic neuropathy, unspecified: Secondary | ICD-10-CM | POA: Diagnosis not present

## 2021-02-04 DIAGNOSIS — E7849 Other hyperlipidemia: Secondary | ICD-10-CM | POA: Diagnosis not present

## 2021-02-04 DIAGNOSIS — I10 Essential (primary) hypertension: Secondary | ICD-10-CM | POA: Diagnosis not present

## 2021-03-07 DIAGNOSIS — I10 Essential (primary) hypertension: Secondary | ICD-10-CM | POA: Diagnosis not present

## 2021-03-07 DIAGNOSIS — E039 Hypothyroidism, unspecified: Secondary | ICD-10-CM | POA: Diagnosis not present

## 2021-03-07 DIAGNOSIS — E7849 Other hyperlipidemia: Secondary | ICD-10-CM | POA: Diagnosis not present

## 2021-03-07 DIAGNOSIS — E1140 Type 2 diabetes mellitus with diabetic neuropathy, unspecified: Secondary | ICD-10-CM | POA: Diagnosis not present

## 2021-04-07 DIAGNOSIS — E7849 Other hyperlipidemia: Secondary | ICD-10-CM | POA: Diagnosis not present

## 2021-04-07 DIAGNOSIS — E1140 Type 2 diabetes mellitus with diabetic neuropathy, unspecified: Secondary | ICD-10-CM | POA: Diagnosis not present

## 2021-04-07 DIAGNOSIS — I10 Essential (primary) hypertension: Secondary | ICD-10-CM | POA: Diagnosis not present

## 2021-04-07 DIAGNOSIS — E039 Hypothyroidism, unspecified: Secondary | ICD-10-CM | POA: Diagnosis not present

## 2021-04-13 DIAGNOSIS — Z23 Encounter for immunization: Secondary | ICD-10-CM | POA: Diagnosis not present

## 2021-04-13 DIAGNOSIS — M1991 Primary osteoarthritis, unspecified site: Secondary | ICD-10-CM | POA: Diagnosis not present

## 2021-04-13 DIAGNOSIS — E118 Type 2 diabetes mellitus with unspecified complications: Secondary | ICD-10-CM | POA: Diagnosis not present

## 2021-04-13 DIAGNOSIS — E039 Hypothyroidism, unspecified: Secondary | ICD-10-CM | POA: Diagnosis not present

## 2021-04-13 DIAGNOSIS — Z6841 Body Mass Index (BMI) 40.0 and over, adult: Secondary | ICD-10-CM | POA: Diagnosis not present

## 2021-04-13 DIAGNOSIS — E7849 Other hyperlipidemia: Secondary | ICD-10-CM | POA: Diagnosis not present

## 2021-05-07 DIAGNOSIS — E1140 Type 2 diabetes mellitus with diabetic neuropathy, unspecified: Secondary | ICD-10-CM | POA: Diagnosis not present

## 2021-05-07 DIAGNOSIS — E782 Mixed hyperlipidemia: Secondary | ICD-10-CM | POA: Diagnosis not present

## 2021-05-07 DIAGNOSIS — I10 Essential (primary) hypertension: Secondary | ICD-10-CM | POA: Diagnosis not present

## 2021-05-07 DIAGNOSIS — E039 Hypothyroidism, unspecified: Secondary | ICD-10-CM | POA: Diagnosis not present

## 2021-06-07 DIAGNOSIS — I10 Essential (primary) hypertension: Secondary | ICD-10-CM | POA: Diagnosis not present

## 2021-06-07 DIAGNOSIS — E039 Hypothyroidism, unspecified: Secondary | ICD-10-CM | POA: Diagnosis not present

## 2021-06-07 DIAGNOSIS — E1140 Type 2 diabetes mellitus with diabetic neuropathy, unspecified: Secondary | ICD-10-CM | POA: Diagnosis not present

## 2021-06-07 DIAGNOSIS — E782 Mixed hyperlipidemia: Secondary | ICD-10-CM | POA: Diagnosis not present

## 2021-07-08 DIAGNOSIS — E7849 Other hyperlipidemia: Secondary | ICD-10-CM | POA: Diagnosis not present

## 2021-07-08 DIAGNOSIS — E039 Hypothyroidism, unspecified: Secondary | ICD-10-CM | POA: Diagnosis not present

## 2021-07-08 DIAGNOSIS — I10 Essential (primary) hypertension: Secondary | ICD-10-CM | POA: Diagnosis not present

## 2021-07-08 DIAGNOSIS — E1140 Type 2 diabetes mellitus with diabetic neuropathy, unspecified: Secondary | ICD-10-CM | POA: Diagnosis not present

## 2021-07-21 DIAGNOSIS — E039 Hypothyroidism, unspecified: Secondary | ICD-10-CM | POA: Diagnosis not present

## 2021-07-21 DIAGNOSIS — E782 Mixed hyperlipidemia: Secondary | ICD-10-CM | POA: Diagnosis not present

## 2021-07-21 DIAGNOSIS — M1991 Primary osteoarthritis, unspecified site: Secondary | ICD-10-CM | POA: Diagnosis not present

## 2021-07-21 DIAGNOSIS — E1142 Type 2 diabetes mellitus with diabetic polyneuropathy: Secondary | ICD-10-CM | POA: Diagnosis not present

## 2021-07-21 DIAGNOSIS — Z6841 Body Mass Index (BMI) 40.0 and over, adult: Secondary | ICD-10-CM | POA: Diagnosis not present

## 2021-07-21 DIAGNOSIS — Z23 Encounter for immunization: Secondary | ICD-10-CM | POA: Diagnosis not present

## 2021-08-07 DIAGNOSIS — I10 Essential (primary) hypertension: Secondary | ICD-10-CM | POA: Diagnosis not present

## 2021-08-07 DIAGNOSIS — E1140 Type 2 diabetes mellitus with diabetic neuropathy, unspecified: Secondary | ICD-10-CM | POA: Diagnosis not present

## 2021-08-07 DIAGNOSIS — E039 Hypothyroidism, unspecified: Secondary | ICD-10-CM | POA: Diagnosis not present

## 2021-08-07 DIAGNOSIS — E782 Mixed hyperlipidemia: Secondary | ICD-10-CM | POA: Diagnosis not present

## 2021-09-07 DIAGNOSIS — E782 Mixed hyperlipidemia: Secondary | ICD-10-CM | POA: Diagnosis not present

## 2021-09-07 DIAGNOSIS — I10 Essential (primary) hypertension: Secondary | ICD-10-CM | POA: Diagnosis not present

## 2021-09-07 DIAGNOSIS — E1140 Type 2 diabetes mellitus with diabetic neuropathy, unspecified: Secondary | ICD-10-CM | POA: Diagnosis not present

## 2021-09-07 DIAGNOSIS — E039 Hypothyroidism, unspecified: Secondary | ICD-10-CM | POA: Diagnosis not present

## 2021-10-07 DIAGNOSIS — E1140 Type 2 diabetes mellitus with diabetic neuropathy, unspecified: Secondary | ICD-10-CM | POA: Diagnosis not present

## 2021-10-07 DIAGNOSIS — E782 Mixed hyperlipidemia: Secondary | ICD-10-CM | POA: Diagnosis not present

## 2021-10-07 DIAGNOSIS — I10 Essential (primary) hypertension: Secondary | ICD-10-CM | POA: Diagnosis not present

## 2021-10-07 DIAGNOSIS — E039 Hypothyroidism, unspecified: Secondary | ICD-10-CM | POA: Diagnosis not present

## 2021-11-06 DIAGNOSIS — E782 Mixed hyperlipidemia: Secondary | ICD-10-CM | POA: Diagnosis not present

## 2021-11-06 DIAGNOSIS — E039 Hypothyroidism, unspecified: Secondary | ICD-10-CM | POA: Diagnosis not present

## 2021-11-06 DIAGNOSIS — E1140 Type 2 diabetes mellitus with diabetic neuropathy, unspecified: Secondary | ICD-10-CM | POA: Diagnosis not present

## 2021-11-06 DIAGNOSIS — I10 Essential (primary) hypertension: Secondary | ICD-10-CM | POA: Diagnosis not present

## 2021-11-11 DIAGNOSIS — E1140 Type 2 diabetes mellitus with diabetic neuropathy, unspecified: Secondary | ICD-10-CM | POA: Diagnosis not present

## 2021-11-11 DIAGNOSIS — E039 Hypothyroidism, unspecified: Secondary | ICD-10-CM | POA: Diagnosis not present

## 2021-11-11 DIAGNOSIS — I10 Essential (primary) hypertension: Secondary | ICD-10-CM | POA: Diagnosis not present

## 2021-11-11 DIAGNOSIS — F329 Major depressive disorder, single episode, unspecified: Secondary | ICD-10-CM | POA: Diagnosis not present

## 2021-11-11 DIAGNOSIS — E782 Mixed hyperlipidemia: Secondary | ICD-10-CM | POA: Diagnosis not present

## 2021-11-11 DIAGNOSIS — E7849 Other hyperlipidemia: Secondary | ICD-10-CM | POA: Diagnosis not present

## 2021-11-11 DIAGNOSIS — Z6841 Body Mass Index (BMI) 40.0 and over, adult: Secondary | ICD-10-CM | POA: Diagnosis not present

## 2021-11-18 DIAGNOSIS — Z0100 Encounter for examination of eyes and vision without abnormal findings: Secondary | ICD-10-CM | POA: Diagnosis not present

## 2021-11-18 DIAGNOSIS — H5200 Hypermetropia, unspecified eye: Secondary | ICD-10-CM | POA: Diagnosis not present

## 2021-12-08 DIAGNOSIS — E1140 Type 2 diabetes mellitus with diabetic neuropathy, unspecified: Secondary | ICD-10-CM | POA: Diagnosis not present

## 2021-12-08 DIAGNOSIS — E7849 Other hyperlipidemia: Secondary | ICD-10-CM | POA: Diagnosis not present

## 2021-12-08 DIAGNOSIS — I10 Essential (primary) hypertension: Secondary | ICD-10-CM | POA: Diagnosis not present

## 2021-12-08 DIAGNOSIS — E039 Hypothyroidism, unspecified: Secondary | ICD-10-CM | POA: Diagnosis not present

## 2022-01-01 ENCOUNTER — Other Ambulatory Visit (HOSPITAL_COMMUNITY): Payer: Self-pay | Admitting: Family Medicine

## 2022-01-01 DIAGNOSIS — Z1231 Encounter for screening mammogram for malignant neoplasm of breast: Secondary | ICD-10-CM

## 2022-01-11 ENCOUNTER — Ambulatory Visit (HOSPITAL_COMMUNITY)
Admission: RE | Admit: 2022-01-11 | Discharge: 2022-01-11 | Disposition: A | Discharge status: Home / Self Care | Payer: Medicare HMO | Source: Ambulatory Visit | Attending: Family Medicine | Admitting: Family Medicine

## 2022-01-11 ENCOUNTER — Other Ambulatory Visit: Payer: Self-pay

## 2022-01-11 DIAGNOSIS — Z1231 Encounter for screening mammogram for malignant neoplasm of breast: Secondary | ICD-10-CM | POA: Diagnosis not present

## 2022-01-13 ENCOUNTER — Other Ambulatory Visit (HOSPITAL_COMMUNITY): Payer: Self-pay | Admitting: Family Medicine

## 2022-01-13 DIAGNOSIS — R928 Other abnormal and inconclusive findings on diagnostic imaging of breast: Secondary | ICD-10-CM

## 2022-01-28 ENCOUNTER — Ambulatory Visit (HOSPITAL_COMMUNITY)
Admission: RE | Admit: 2022-01-28 | Discharge: 2022-01-28 | Disposition: A | Discharge status: Home / Self Care | Payer: Medicare HMO | Source: Ambulatory Visit | Attending: Family Medicine | Admitting: Family Medicine

## 2022-01-28 ENCOUNTER — Other Ambulatory Visit: Payer: Self-pay

## 2022-01-28 DIAGNOSIS — R928 Other abnormal and inconclusive findings on diagnostic imaging of breast: Secondary | ICD-10-CM

## 2022-01-28 DIAGNOSIS — R922 Inconclusive mammogram: Secondary | ICD-10-CM | POA: Diagnosis not present

## 2022-02-05 DIAGNOSIS — E1140 Type 2 diabetes mellitus with diabetic neuropathy, unspecified: Secondary | ICD-10-CM | POA: Diagnosis not present

## 2022-02-05 DIAGNOSIS — I10 Essential (primary) hypertension: Secondary | ICD-10-CM | POA: Diagnosis not present

## 2022-02-05 DIAGNOSIS — E039 Hypothyroidism, unspecified: Secondary | ICD-10-CM | POA: Diagnosis not present

## 2022-02-05 DIAGNOSIS — E782 Mixed hyperlipidemia: Secondary | ICD-10-CM | POA: Diagnosis not present

## 2022-02-09 ENCOUNTER — Other Ambulatory Visit (HOSPITAL_COMMUNITY): Payer: Medicare HMO

## 2022-02-09 ENCOUNTER — Encounter (HOSPITAL_COMMUNITY): Payer: Medicare HMO

## 2022-03-07 DIAGNOSIS — E782 Mixed hyperlipidemia: Secondary | ICD-10-CM | POA: Diagnosis not present

## 2022-03-07 DIAGNOSIS — E039 Hypothyroidism, unspecified: Secondary | ICD-10-CM | POA: Diagnosis not present

## 2022-03-07 DIAGNOSIS — E1140 Type 2 diabetes mellitus with diabetic neuropathy, unspecified: Secondary | ICD-10-CM | POA: Diagnosis not present

## 2022-03-07 DIAGNOSIS — I10 Essential (primary) hypertension: Secondary | ICD-10-CM | POA: Diagnosis not present

## 2022-03-15 DIAGNOSIS — E039 Hypothyroidism, unspecified: Secondary | ICD-10-CM | POA: Diagnosis not present

## 2022-03-15 DIAGNOSIS — F329 Major depressive disorder, single episode, unspecified: Secondary | ICD-10-CM | POA: Diagnosis not present

## 2022-03-15 DIAGNOSIS — Z6841 Body Mass Index (BMI) 40.0 and over, adult: Secondary | ICD-10-CM | POA: Diagnosis not present

## 2022-03-15 DIAGNOSIS — Z0001 Encounter for general adult medical examination with abnormal findings: Secondary | ICD-10-CM | POA: Diagnosis not present

## 2022-03-15 DIAGNOSIS — E782 Mixed hyperlipidemia: Secondary | ICD-10-CM | POA: Diagnosis not present

## 2022-03-15 DIAGNOSIS — E785 Hyperlipidemia, unspecified: Secondary | ICD-10-CM | POA: Diagnosis not present

## 2022-03-15 DIAGNOSIS — E1140 Type 2 diabetes mellitus with diabetic neuropathy, unspecified: Secondary | ICD-10-CM | POA: Diagnosis not present

## 2022-03-15 DIAGNOSIS — I10 Essential (primary) hypertension: Secondary | ICD-10-CM | POA: Diagnosis not present

## 2022-03-15 DIAGNOSIS — Z1331 Encounter for screening for depression: Secondary | ICD-10-CM | POA: Diagnosis not present

## 2022-04-07 DIAGNOSIS — I10 Essential (primary) hypertension: Secondary | ICD-10-CM | POA: Diagnosis not present

## 2022-04-07 DIAGNOSIS — E039 Hypothyroidism, unspecified: Secondary | ICD-10-CM | POA: Diagnosis not present

## 2022-04-07 DIAGNOSIS — E782 Mixed hyperlipidemia: Secondary | ICD-10-CM | POA: Diagnosis not present

## 2022-04-07 DIAGNOSIS — E1140 Type 2 diabetes mellitus with diabetic neuropathy, unspecified: Secondary | ICD-10-CM | POA: Diagnosis not present

## 2022-07-20 DIAGNOSIS — Z23 Encounter for immunization: Secondary | ICD-10-CM | POA: Diagnosis not present

## 2022-07-20 DIAGNOSIS — E782 Mixed hyperlipidemia: Secondary | ICD-10-CM | POA: Diagnosis not present

## 2022-07-20 DIAGNOSIS — E039 Hypothyroidism, unspecified: Secondary | ICD-10-CM | POA: Diagnosis not present

## 2022-07-20 DIAGNOSIS — E119 Type 2 diabetes mellitus without complications: Secondary | ICD-10-CM | POA: Diagnosis not present

## 2022-07-20 DIAGNOSIS — Z6841 Body Mass Index (BMI) 40.0 and over, adult: Secondary | ICD-10-CM | POA: Diagnosis not present

## 2022-07-20 DIAGNOSIS — E1140 Type 2 diabetes mellitus with diabetic neuropathy, unspecified: Secondary | ICD-10-CM | POA: Diagnosis not present

## 2022-07-20 DIAGNOSIS — I10 Essential (primary) hypertension: Secondary | ICD-10-CM | POA: Diagnosis not present

## 2022-11-17 DIAGNOSIS — Z6841 Body Mass Index (BMI) 40.0 and over, adult: Secondary | ICD-10-CM | POA: Diagnosis not present

## 2022-11-17 DIAGNOSIS — E782 Mixed hyperlipidemia: Secondary | ICD-10-CM | POA: Diagnosis not present

## 2022-11-17 DIAGNOSIS — F329 Major depressive disorder, single episode, unspecified: Secondary | ICD-10-CM | POA: Diagnosis not present

## 2022-11-17 DIAGNOSIS — E039 Hypothyroidism, unspecified: Secondary | ICD-10-CM | POA: Diagnosis not present

## 2022-11-17 DIAGNOSIS — I10 Essential (primary) hypertension: Secondary | ICD-10-CM | POA: Diagnosis not present

## 2022-11-17 DIAGNOSIS — E1140 Type 2 diabetes mellitus with diabetic neuropathy, unspecified: Secondary | ICD-10-CM | POA: Diagnosis not present

## 2022-11-17 DIAGNOSIS — J45909 Unspecified asthma, uncomplicated: Secondary | ICD-10-CM | POA: Diagnosis not present

## 2022-11-17 DIAGNOSIS — K219 Gastro-esophageal reflux disease without esophagitis: Secondary | ICD-10-CM | POA: Diagnosis not present

## 2022-11-22 DIAGNOSIS — E119 Type 2 diabetes mellitus without complications: Secondary | ICD-10-CM | POA: Diagnosis not present

## 2023-03-18 DIAGNOSIS — E1140 Type 2 diabetes mellitus with diabetic neuropathy, unspecified: Secondary | ICD-10-CM | POA: Diagnosis not present

## 2023-03-18 DIAGNOSIS — Z1331 Encounter for screening for depression: Secondary | ICD-10-CM | POA: Diagnosis not present

## 2023-03-18 DIAGNOSIS — F329 Major depressive disorder, single episode, unspecified: Secondary | ICD-10-CM | POA: Diagnosis not present

## 2023-03-18 DIAGNOSIS — E782 Mixed hyperlipidemia: Secondary | ICD-10-CM | POA: Diagnosis not present

## 2023-03-18 DIAGNOSIS — J45909 Unspecified asthma, uncomplicated: Secondary | ICD-10-CM | POA: Diagnosis not present

## 2023-03-18 DIAGNOSIS — E039 Hypothyroidism, unspecified: Secondary | ICD-10-CM | POA: Diagnosis not present

## 2023-03-18 DIAGNOSIS — Z0001 Encounter for general adult medical examination with abnormal findings: Secondary | ICD-10-CM | POA: Diagnosis not present

## 2023-03-18 DIAGNOSIS — Z6841 Body Mass Index (BMI) 40.0 and over, adult: Secondary | ICD-10-CM | POA: Diagnosis not present

## 2023-03-18 DIAGNOSIS — I10 Essential (primary) hypertension: Secondary | ICD-10-CM | POA: Diagnosis not present

## 2023-06-27 ENCOUNTER — Other Ambulatory Visit (HOSPITAL_COMMUNITY): Payer: Self-pay | Admitting: Family Medicine

## 2023-06-27 DIAGNOSIS — Z1231 Encounter for screening mammogram for malignant neoplasm of breast: Secondary | ICD-10-CM

## 2023-07-04 ENCOUNTER — Ambulatory Visit (HOSPITAL_COMMUNITY)
Admission: RE | Admit: 2023-07-04 | Discharge: 2023-07-04 | Disposition: A | Discharge status: Home / Self Care | Payer: Medicare HMO | Source: Ambulatory Visit | Attending: Family Medicine | Admitting: Family Medicine

## 2023-07-04 DIAGNOSIS — Z1231 Encounter for screening mammogram for malignant neoplasm of breast: Secondary | ICD-10-CM | POA: Insufficient documentation

## 2023-07-22 DIAGNOSIS — F329 Major depressive disorder, single episode, unspecified: Secondary | ICD-10-CM | POA: Diagnosis not present

## 2023-07-22 DIAGNOSIS — E1140 Type 2 diabetes mellitus with diabetic neuropathy, unspecified: Secondary | ICD-10-CM | POA: Diagnosis not present

## 2023-07-22 DIAGNOSIS — E7849 Other hyperlipidemia: Secondary | ICD-10-CM | POA: Diagnosis not present

## 2023-07-22 DIAGNOSIS — K219 Gastro-esophageal reflux disease without esophagitis: Secondary | ICD-10-CM | POA: Diagnosis not present

## 2023-07-22 DIAGNOSIS — E785 Hyperlipidemia, unspecified: Secondary | ICD-10-CM | POA: Diagnosis not present

## 2023-07-22 DIAGNOSIS — Z6841 Body Mass Index (BMI) 40.0 and over, adult: Secondary | ICD-10-CM | POA: Diagnosis not present

## 2023-07-22 DIAGNOSIS — E1165 Type 2 diabetes mellitus with hyperglycemia: Secondary | ICD-10-CM | POA: Diagnosis not present

## 2023-07-22 DIAGNOSIS — Z23 Encounter for immunization: Secondary | ICD-10-CM | POA: Diagnosis not present

## 2023-07-22 DIAGNOSIS — I10 Essential (primary) hypertension: Secondary | ICD-10-CM | POA: Diagnosis not present

## 2023-07-22 DIAGNOSIS — E039 Hypothyroidism, unspecified: Secondary | ICD-10-CM | POA: Diagnosis not present

## 2023-11-28 ENCOUNTER — Ambulatory Visit (HOSPITAL_COMMUNITY)
Admission: RE | Admit: 2023-11-28 | Discharge: 2023-11-28 | Disposition: A | Discharge status: Home / Self Care | Payer: Medicare HMO | Source: Ambulatory Visit | Attending: Family Medicine | Admitting: Family Medicine

## 2023-11-28 ENCOUNTER — Other Ambulatory Visit (HOSPITAL_COMMUNITY): Payer: Self-pay | Admitting: Family Medicine

## 2023-11-28 DIAGNOSIS — E1140 Type 2 diabetes mellitus with diabetic neuropathy, unspecified: Secondary | ICD-10-CM | POA: Diagnosis not present

## 2023-11-28 DIAGNOSIS — E782 Mixed hyperlipidemia: Secondary | ICD-10-CM | POA: Diagnosis not present

## 2023-11-28 DIAGNOSIS — M546 Pain in thoracic spine: Secondary | ICD-10-CM

## 2023-11-28 DIAGNOSIS — E039 Hypothyroidism, unspecified: Secondary | ICD-10-CM | POA: Diagnosis not present

## 2023-11-28 DIAGNOSIS — M549 Dorsalgia, unspecified: Secondary | ICD-10-CM | POA: Diagnosis not present

## 2023-11-28 DIAGNOSIS — R059 Cough, unspecified: Secondary | ICD-10-CM | POA: Diagnosis not present

## 2023-11-28 DIAGNOSIS — E1165 Type 2 diabetes mellitus with hyperglycemia: Secondary | ICD-10-CM | POA: Diagnosis not present

## 2023-11-28 DIAGNOSIS — I10 Essential (primary) hypertension: Secondary | ICD-10-CM | POA: Diagnosis not present

## 2023-11-28 DIAGNOSIS — Z6841 Body Mass Index (BMI) 40.0 and over, adult: Secondary | ICD-10-CM | POA: Diagnosis not present

## 2023-11-28 DIAGNOSIS — E785 Hyperlipidemia, unspecified: Secondary | ICD-10-CM | POA: Diagnosis not present

## 2023-12-07 DIAGNOSIS — E119 Type 2 diabetes mellitus without complications: Secondary | ICD-10-CM | POA: Diagnosis not present

## 2024-03-26 DIAGNOSIS — M1991 Primary osteoarthritis, unspecified site: Secondary | ICD-10-CM | POA: Diagnosis not present

## 2024-03-26 DIAGNOSIS — E1140 Type 2 diabetes mellitus with diabetic neuropathy, unspecified: Secondary | ICD-10-CM | POA: Diagnosis not present

## 2024-03-26 DIAGNOSIS — E039 Hypothyroidism, unspecified: Secondary | ICD-10-CM | POA: Diagnosis not present

## 2024-03-26 DIAGNOSIS — Z0001 Encounter for general adult medical examination with abnormal findings: Secondary | ICD-10-CM | POA: Diagnosis not present

## 2024-03-26 DIAGNOSIS — I10 Essential (primary) hypertension: Secondary | ICD-10-CM | POA: Diagnosis not present

## 2024-03-26 DIAGNOSIS — E782 Mixed hyperlipidemia: Secondary | ICD-10-CM | POA: Diagnosis not present

## 2024-03-26 DIAGNOSIS — E1165 Type 2 diabetes mellitus with hyperglycemia: Secondary | ICD-10-CM | POA: Diagnosis not present

## 2024-06-26 DIAGNOSIS — J45909 Unspecified asthma, uncomplicated: Secondary | ICD-10-CM | POA: Diagnosis not present

## 2024-06-26 DIAGNOSIS — E782 Mixed hyperlipidemia: Secondary | ICD-10-CM | POA: Diagnosis not present

## 2024-06-26 DIAGNOSIS — E039 Hypothyroidism, unspecified: Secondary | ICD-10-CM | POA: Diagnosis not present

## 2024-06-26 DIAGNOSIS — E1140 Type 2 diabetes mellitus with diabetic neuropathy, unspecified: Secondary | ICD-10-CM | POA: Diagnosis not present

## 2024-08-28 ENCOUNTER — Other Ambulatory Visit (HOSPITAL_COMMUNITY): Payer: Self-pay | Admitting: Family Medicine

## 2024-08-28 DIAGNOSIS — Z1231 Encounter for screening mammogram for malignant neoplasm of breast: Secondary | ICD-10-CM

## 2024-09-03 ENCOUNTER — Ambulatory Visit (HOSPITAL_COMMUNITY)
Admission: RE | Admit: 2024-09-03 | Discharge: 2024-09-03 | Disposition: A | Discharge status: Home / Self Care | Source: Ambulatory Visit | Attending: Family Medicine | Admitting: Family Medicine

## 2024-09-03 ENCOUNTER — Encounter (HOSPITAL_COMMUNITY): Payer: Self-pay

## 2024-09-03 DIAGNOSIS — Z1231 Encounter for screening mammogram for malignant neoplasm of breast: Secondary | ICD-10-CM | POA: Diagnosis present
